# Patient Record
Sex: Male | Born: 1975 | Race: Black or African American | Hispanic: No | Marital: Married | Smoking: Former smoker
Health system: Southern US, Community
[De-identification: ages and names within clinical notes are randomized; demographics above are authoritative.]

## PROBLEM LIST (undated history)

## (undated) DIAGNOSIS — G4733 Obstructive sleep apnea (adult) (pediatric): Secondary | ICD-10-CM

## (undated) DIAGNOSIS — T7840XA Allergy, unspecified, initial encounter: Secondary | ICD-10-CM

## (undated) DIAGNOSIS — G4730 Sleep apnea, unspecified: Secondary | ICD-10-CM

## (undated) DIAGNOSIS — K219 Gastro-esophageal reflux disease without esophagitis: Secondary | ICD-10-CM

## (undated) DIAGNOSIS — M170 Bilateral primary osteoarthritis of knee: Secondary | ICD-10-CM

## (undated) DIAGNOSIS — F419 Anxiety disorder, unspecified: Secondary | ICD-10-CM

## (undated) DIAGNOSIS — I10 Essential (primary) hypertension: Secondary | ICD-10-CM

## (undated) HISTORY — DX: Gastro-esophageal reflux disease without esophagitis: K21.9

## (undated) HISTORY — PX: JOINT REPLACEMENT: SHX530

## (undated) HISTORY — PX: COLONOSCOPY: SHX174

## (undated) HISTORY — DX: Allergy, unspecified, initial encounter: T78.40XA

## (undated) HISTORY — DX: Sleep apnea, unspecified: G47.30

## (undated) HISTORY — DX: Essential (primary) hypertension: I10

## (undated) HISTORY — DX: Anxiety disorder, unspecified: F41.9

## (undated) HISTORY — PX: NO PAST SURGERIES: SHX2092

---

## 2001-06-05 ENCOUNTER — Encounter: Payer: Self-pay | Admitting: Emergency Medicine

## 2001-06-05 ENCOUNTER — Emergency Department (HOSPITAL_COMMUNITY): Admission: EM | Admit: 2001-06-05 | Discharge: 2001-06-05 | Payer: Self-pay | Admitting: Emergency Medicine

## 2001-06-16 ENCOUNTER — Emergency Department (HOSPITAL_COMMUNITY): Admission: EM | Admit: 2001-06-16 | Discharge: 2001-06-17 | Payer: Self-pay | Admitting: Emergency Medicine

## 2001-10-04 ENCOUNTER — Emergency Department (HOSPITAL_COMMUNITY): Admission: EM | Admit: 2001-10-04 | Discharge: 2001-10-04 | Payer: Self-pay | Admitting: Emergency Medicine

## 2002-01-28 ENCOUNTER — Emergency Department (HOSPITAL_COMMUNITY): Admission: EM | Admit: 2002-01-28 | Discharge: 2002-01-28 | Payer: Self-pay | Admitting: Emergency Medicine

## 2002-01-28 ENCOUNTER — Encounter: Payer: Self-pay | Admitting: Emergency Medicine

## 2004-07-02 ENCOUNTER — Emergency Department (HOSPITAL_COMMUNITY): Admission: EM | Admit: 2004-07-02 | Discharge: 2004-07-02 | Payer: Self-pay | Admitting: Emergency Medicine

## 2006-04-24 ENCOUNTER — Emergency Department (HOSPITAL_COMMUNITY): Admission: EM | Admit: 2006-04-24 | Discharge: 2006-04-24 | Payer: Self-pay | Admitting: Emergency Medicine

## 2006-05-03 ENCOUNTER — Emergency Department (HOSPITAL_COMMUNITY): Admission: EM | Admit: 2006-05-03 | Discharge: 2006-05-03 | Payer: Self-pay | Admitting: Emergency Medicine

## 2006-05-18 ENCOUNTER — Emergency Department (HOSPITAL_COMMUNITY): Admission: EM | Admit: 2006-05-18 | Discharge: 2006-05-18 | Payer: Self-pay | Admitting: *Deleted

## 2006-08-19 ENCOUNTER — Emergency Department (HOSPITAL_COMMUNITY): Admission: EM | Admit: 2006-08-19 | Discharge: 2006-08-19 | Payer: Self-pay | Admitting: Emergency Medicine

## 2006-11-17 ENCOUNTER — Emergency Department (HOSPITAL_COMMUNITY): Admission: EM | Admit: 2006-11-17 | Discharge: 2006-11-17 | Payer: Self-pay | Admitting: Emergency Medicine

## 2007-02-04 ENCOUNTER — Emergency Department (HOSPITAL_COMMUNITY): Admission: EM | Admit: 2007-02-04 | Discharge: 2007-02-04 | Payer: Self-pay | Admitting: Family Medicine

## 2011-04-28 ENCOUNTER — Emergency Department (HOSPITAL_BASED_OUTPATIENT_CLINIC_OR_DEPARTMENT_OTHER)
Admission: EM | Admit: 2011-04-28 | Discharge: 2011-04-28 | Disposition: A | Discharge status: Home / Self Care | Payer: BC Managed Care – PPO | Attending: Emergency Medicine | Admitting: Emergency Medicine

## 2011-04-28 ENCOUNTER — Encounter: Payer: Self-pay | Admitting: *Deleted

## 2011-04-28 DIAGNOSIS — X58XXXA Exposure to other specified factors, initial encounter: Secondary | ICD-10-CM | POA: Insufficient documentation

## 2011-04-28 DIAGNOSIS — F17200 Nicotine dependence, unspecified, uncomplicated: Secondary | ICD-10-CM | POA: Insufficient documentation

## 2011-04-28 DIAGNOSIS — S025XXA Fracture of tooth (traumatic), initial encounter for closed fracture: Secondary | ICD-10-CM | POA: Insufficient documentation

## 2011-04-28 MED ORDER — OXYCODONE-ACETAMINOPHEN 5-325 MG PO TABS: 2.0000 | ORAL_TABLET | ORAL | Status: AC | PRN

## 2011-04-28 MED ORDER — IBUPROFEN 600 MG PO TABS: 600.0000 mg | ORAL_TABLET | Freq: Four times a day (QID) | ORAL | Status: AC | PRN

## 2011-04-28 MED ORDER — OXYCODONE-ACETAMINOPHEN 5-325 MG PO TABS
2.0000 | ORAL_TABLET | Freq: Once | ORAL | Status: AC
  Administered 2011-04-28: 2 via ORAL
  Filled 2011-04-28: qty 2

## 2011-04-28 NOTE — ED Notes (Signed)
Pt presents to ED with dental pain.  Pt has some redness noted to upper and lower teeth.  Pt reports habit of grinding teeth.

## 2011-04-28 NOTE — ED Notes (Signed)
Family at bedside. 

## 2011-04-28 NOTE — ED Provider Notes (Signed)
History     Chief Complaint  Patient presents with  . Dental Pain   Patient is a 35 y.o. male presenting with tooth pain. The history is provided by the patient.  Dental PainThe primary symptoms include mouth pain. Primary symptoms do not include dental injury, oral bleeding, oral lesions or fever. The symptoms began yesterday. The symptoms are worsening. The symptoms are recurrent. The symptoms occur constantly.  Additional symptoms include: dental sensitivity to temperature and gum swelling. Additional symptoms do not include: jaw pain and facial swelling.    History reviewed. No pertinent past medical history.  History reviewed. No pertinent past surgical history.  History reviewed. No pertinent family history.  History  Substance Use Topics  . Smoking status: Current Everyday Smoker -- 0.5 packs/day  . Smokeless tobacco: Not on file  . Alcohol Use: No      Review of Systems  Constitutional: Negative for fever.  HENT: Negative for facial swelling.   All other systems reviewed and are negative.    Physical Exam  BP 133/74  Pulse 85  Temp(Src) 98 F (36.7 C) (Oral)  SpO2 98%  Physical Exam  Nursing note and vitals reviewed. Constitutional: He is oriented to person, place, and time. Vital signs are normal. He appears well-developed and well-nourished.  Non-toxic appearance.  HENT:  Head: Normocephalic and atraumatic.  Mouth/Throat: Dental caries present. No dental abscesses.  Eyes: Conjunctivae are normal. Pupils are equal, round, and reactive to light.  Neck: Normal range of motion.  Cardiovascular: Normal rate.   Pulmonary/Chest: Effort normal.  Neurological: He is alert and oriented to person, place, and time.  Skin: Skin is warm and dry.  Psychiatric: He has a normal mood and affect.    ED Course  Procedures  MDM  Pain meds given, pt to see his dentist      Toy Baker, MD 04/28/11 [phone]

## 2011-04-28 NOTE — ED Notes (Signed)
Pt presents to ED today with upper and lower dental pain since yesterday.  Pt took OTC ibuprofen around 2000 with no relief

## 2011-05-24 ENCOUNTER — Encounter: Payer: Self-pay | Admitting: *Deleted

## 2011-05-24 ENCOUNTER — Emergency Department (HOSPITAL_BASED_OUTPATIENT_CLINIC_OR_DEPARTMENT_OTHER)
Admission: EM | Admit: 2011-05-24 | Discharge: 2011-05-24 | Disposition: A | Discharge status: Home / Self Care | Payer: BC Managed Care – PPO | Attending: Emergency Medicine | Admitting: Emergency Medicine

## 2011-05-24 ENCOUNTER — Emergency Department (INDEPENDENT_AMBULATORY_CARE_PROVIDER_SITE_OTHER): Payer: BC Managed Care – PPO

## 2011-05-24 DIAGNOSIS — R319 Hematuria, unspecified: Secondary | ICD-10-CM

## 2011-05-24 DIAGNOSIS — F17200 Nicotine dependence, unspecified, uncomplicated: Secondary | ICD-10-CM | POA: Insufficient documentation

## 2011-05-24 DIAGNOSIS — N3000 Acute cystitis without hematuria: Secondary | ICD-10-CM | POA: Insufficient documentation

## 2011-05-24 LAB — URINALYSIS, ROUTINE W REFLEX MICROSCOPIC
Bilirubin Urine: NEGATIVE
Glucose, UA: NEGATIVE mg/dL
Ketones, ur: NEGATIVE mg/dL
Leukocytes, UA: NEGATIVE
Nitrite: NEGATIVE
Protein, ur: NEGATIVE mg/dL
Specific Gravity, Urine: 1.022 (ref 1.005–1.030)
Urobilinogen, UA: 0.2 mg/dL (ref 0.0–1.0)
pH: 5.5 (ref 5.0–8.0)

## 2011-05-24 LAB — CBC
Hemoglobin: 14.9 g/dL (ref 13.0–17.0)
MCH: 32 pg (ref 26.0–34.0)
MCV: 89.1 fL (ref 78.0–100.0)
Platelets: 271 10*3/uL (ref 150–400)
RBC: 4.66 MIL/uL (ref 4.22–5.81)

## 2011-05-24 LAB — DIFFERENTIAL
Eosinophils Absolute: 0.2 10*3/uL (ref 0.0–0.7)
Eosinophils Relative: 3 % (ref 0–5)
Lymphs Abs: 3.4 10*3/uL (ref 0.7–4.0)
Monocytes Relative: 9 % (ref 3–12)

## 2011-05-24 LAB — BASIC METABOLIC PANEL
BUN: 11 mg/dL (ref 6–23)
CO2: 26 mEq/L (ref 19–32)
Calcium: 10 mg/dL (ref 8.4–10.5)
Chloride: 105 mEq/L (ref 96–112)
Creatinine, Ser: 0.8 mg/dL (ref 0.50–1.35)
GFR calc Af Amer: >60 mL/min (ref >60)
GFR calc non Af Amer: >60 mL/min (ref >60)
Glucose, Bld: 103 mg/dL — ABNORMAL HIGH (ref 70–99)
Potassium: 4 mEq/L (ref 3.5–5.1)
Sodium: 139 mEq/L (ref 135–145)

## 2011-05-24 LAB — URINE MICROSCOPIC-ADD ON

## 2011-05-24 MED ORDER — CIPROFLOXACIN HCL 500 MG PO TABS
500.0000 mg | ORAL_TABLET | Freq: Once | ORAL | Status: AC
  Administered 2011-05-24: 500 mg via ORAL
  Filled 2011-05-24: qty 1

## 2011-05-24 MED ORDER — CIPROFLOXACIN HCL 500 MG PO TABS: 500.0000 mg | ORAL_TABLET | Freq: Two times a day (BID) | ORAL | Status: AC

## 2011-05-24 NOTE — ED Provider Notes (Addendum)
History    the patient presents for evaluation today of blood seen in his urine. He reports waking up this morning and using the bathroom to void his bladder, and noticing blood in the toilet bowl in with his urine. He then used the bathroom later a second time and again noted blood in his urine. He denies any pain, dysuria, urinary urgency, urinary frequency, flank pain, abdominal pain, fever, chills, nausea or vomiting. She denies myalgia, recent trauma to the flanks, or previous symptoms similarly. He denies urinary hesitancy or retention.  CSN: [id number] Arrival date & time: 05/24/2011  5:55 AM  Chief Complaint  Patient presents with  . Hematuria   Patient is a 35 y.o. male presenting with hematuria. The history is provided by the patient.  Hematuria This is a new problem. The current episode started today. The problem has been gradually improving since onset. He describes the hematuria as gross hematuria. The hematuria occurs during the initial portion of his urinary stream. He reports no clotting in his urine stream. His pain is at a severity of 0/10. He is experiencing no pain. He describes his urine color as tea colored. Irritative symptoms do not include frequency, nocturia or urgency. Obstructive symptoms do not include dribbling, incomplete emptying, an intermittent stream, a slower stream, straining or a weak stream. Pertinent negatives include no abdominal pain, chills, dysuria, facial swelling, fever, flank pain, genital pain, hesitancy, inability to urinate, nausea or vomiting. His sexual activity is non-contributory to the current illness. His past medical history is significant for tobacco use. There is no history of BPH, GU trauma, kidney stones, prostatitis or recent infection.    History reviewed. No pertinent past medical history.  History reviewed. No pertinent past surgical history.  No family history on file.  History  Substance Use Topics  . Smoking status: Current  Everyday Smoker -- 0.5 packs/day    Types: Cigarettes  . Smokeless tobacco: Not on file  . Alcohol Use: No      Review of Systems  Constitutional: Negative for fever and chills.  HENT: Negative for facial swelling.   Eyes: Negative.   Respiratory: Negative.   Cardiovascular: Negative.   Gastrointestinal: Negative for nausea, vomiting and abdominal pain.  Genitourinary: Positive for hematuria. Negative for dysuria, hesitancy, urgency, frequency, flank pain, incomplete emptying and nocturia.  Musculoskeletal: Negative for myalgias, back pain, joint swelling and arthralgias.  Skin: Negative for rash.  Neurological: Negative for light-headedness and headaches.  Hematological: Does not bruise/bleed easily.  Psychiatric/Behavioral: Negative.     Physical Exam  BP 134/86  Pulse 79  Temp(Src) 97.8 F (36.6 C) (Oral)  Resp 16  SpO2 97%  Physical Exam  Nursing note and vitals reviewed. Constitutional: He is oriented to person, place, and time. He appears well-developed and well-nourished. No distress.  HENT:  Head: Normocephalic and atraumatic.  Nose: Nose normal.  Mouth/Throat: Oropharynx is clear and moist. No oropharyngeal exudate.  Eyes: Conjunctivae and EOM are normal. Pupils are equal, round, and reactive to light. Right eye exhibits no discharge. Left eye exhibits no discharge.  Neck: Normal range of motion. Neck supple.  Cardiovascular: Normal rate, regular rhythm and normal heart sounds.  Exam reveals no gallop and no friction rub.   No murmur heard. Pulmonary/Chest: Effort normal and breath sounds normal. No respiratory distress. He has no wheezes. He has no rales. He exhibits no tenderness.  Abdominal: Soft. Bowel sounds are normal. He exhibits no distension. There is no tenderness. There is no rebound  and no guarding.  Genitourinary: Testes normal and penis normal. Cremasteric reflex is present. Right testis shows no mass, no swelling and no tenderness. Left testis shows  no mass, no swelling and no tenderness. Uncircumcised. No penile erythema or penile tenderness. No discharge found.  Musculoskeletal: Normal range of motion. He exhibits no edema and no tenderness.  Neurological: He is alert and oriented to person, place, and time. No cranial nerve deficit.  Skin: Skin is warm and dry. No rash noted. No erythema.  Psychiatric: He has a normal mood and affect. His behavior is normal. Judgment and thought content normal.    ED Course  Procedures  MDM Differential diagnostic considerations include glomerular source of bleeding, extra glomerular source of bleeding, hemorrhagic cystitis, malignancy, urinary tract infection, ureteral calculus, rhabdomyolysis, or idiopathic transient hematuria. As the patient is a smoker and has gross hematuria, if it is determined that this is an extra glomerular source of bleeding as determined by absence of red cell casts in the urine, I will need to obtain a noncontrast CT of the abdomen and pelvis to evaluate for malignancy.   Urinalysis did not suggest the glomerular cause of hematuria, and as such the CT scan of the abdomen and pelvis without contrast to evaluate the renal and urologic tract was performed and results normal. There are a few bacteria in the patient's urine along with blood representative of some degree of inflammation, and as such I've sent a urine culture to evaluate further for urinary tract infection. I will treat the patient as a cystitis with antibiotics. However given that he has had an episode of gross hematuria, I will be referring him to urology to be evaluated for possible need of cystoscopy as well as to evaluate for prostatic enlargement or other cause that would predispose him to urinary tract infection. I reviewed this plan of care with the patient and he states his understanding of and agreement with it.   Felisa Bonier, MD 05/24/11 1610  Felisa Bonier, MD 05/24/11 [id number]  Felisa Bonier,  MD 05/24/11 [phone]

## 2011-05-24 NOTE — ED Notes (Signed)
Pt presents to ED today with blood in urine x2 this am.  Pt states "woke up and used the bathroom and saw the blood"  Pt has no history of similar episodes

## 2011-05-25 LAB — URINE CULTURE
Colony Count: NO GROWTH
Culture  Setup Time: 201208161458
Culture: NO GROWTH

## 2012-05-13 ENCOUNTER — Encounter (HOSPITAL_BASED_OUTPATIENT_CLINIC_OR_DEPARTMENT_OTHER): Payer: Self-pay | Admitting: *Deleted

## 2012-05-13 ENCOUNTER — Emergency Department (HOSPITAL_BASED_OUTPATIENT_CLINIC_OR_DEPARTMENT_OTHER)
Admission: EM | Admit: 2012-05-13 | Discharge: 2012-05-13 | Disposition: A | Discharge status: Home / Self Care | Payer: BC Managed Care – PPO | Attending: Emergency Medicine | Admitting: Emergency Medicine

## 2012-05-13 DIAGNOSIS — F17200 Nicotine dependence, unspecified, uncomplicated: Secondary | ICD-10-CM | POA: Insufficient documentation

## 2012-05-13 DIAGNOSIS — J069 Acute upper respiratory infection, unspecified: Secondary | ICD-10-CM

## 2012-05-13 DIAGNOSIS — H9209 Otalgia, unspecified ear: Secondary | ICD-10-CM

## 2012-05-13 MED ORDER — ANTIPYRINE-BENZOCAINE 5.4-1.4 % OT SOLN
3.0000 [drp] | OTIC | Status: DC | PRN
  Administered 2012-05-13: 3 [drp] via OTIC
  Filled 2012-05-13: qty 10

## 2012-05-13 MED ORDER — AZITHROMYCIN 250 MG PO TABS: 250.0000 mg | ORAL_TABLET | Freq: Every day | ORAL | Status: AC

## 2012-05-13 MED ORDER — LORATADINE-PSEUDOEPHEDRINE ER 10-240 MG PO TB24: 1.0000 | ORAL_TABLET | Freq: Every day | ORAL | Status: AC

## 2012-05-13 NOTE — ED Provider Notes (Signed)
History     CSN: [id number]  Arrival date & time 05/13/12  1603   First MD Initiated Contact with Patient 05/13/12 1649      Chief Complaint  Patient presents with  . Otalgia    (Consider location/radiation/quality/duration/timing/severity/associated sxs/prior treatment) Patient is a 36 y.o. male presenting with ear pain. The history is provided by the patient.  Otalgia This is a new problem. The current episode started more than 2 days ago. There is pain in the right ear. The problem has been gradually worsening. There has been no fever. Pertinent negatives include no ear discharge, no hearing loss, no rhinorrhea, no sore throat, no vomiting, no neck pain and no cough. Associated symptoms comments: Right ear pain that is progressively worse over the last 2-3 days, causing right sided headache. He feels mildly lightheaded and has had some nausea. No room-spinning dizziness. No fever, significant nasal congestion but reports possible sinus pressure that he tried taking "sinus medicine" for without relief. No sore throat. No cough. He denies ear drainage, bleeding or hearing changes..    History reviewed. No pertinent past medical history.  History reviewed. No pertinent past surgical history.  History reviewed. No pertinent family history.  History  Substance Use Topics  . Smoking status: Current Everyday Smoker -- 0.5 packs/day    Types: Cigarettes  . Smokeless tobacco: Not on file  . Alcohol Use: No      Review of Systems  Constitutional: Positive for chills. Negative for fever.  HENT: Positive for ear pain. Negative for hearing loss, sore throat, rhinorrhea, neck pain and ear discharge.   Respiratory: Negative for cough.   Gastrointestinal: Negative for vomiting.    Allergies  Review of patient's allergies indicates no known allergies.  Home Medications  No current outpatient prescriptions on file.  BP 147/77  Pulse 99  Temp 98.9 F (37.2 C)  Resp 16  Ht 5\' 8"   (1.727 m)  Wt 190 lb (86.183 kg)  BMI 28.89 kg/m2  SpO2 99%  Physical Exam  Constitutional: He is oriented to person, place, and time. He appears well-developed and well-nourished.  HENT:  Head: Normocephalic.  Left Ear: External ear normal.  Nose: Mucosal edema present. Right sinus exhibits frontal sinus tenderness. Left sinus exhibits frontal sinus tenderness.  Mouth/Throat: Oropharynx is clear and moist.       Right TM has fluid collection that does not appear purulent in middle ear. No redness. TM is full, not dull. External canal is unremarkable.  Neck: Normal range of motion. Neck supple.  Cardiovascular: Normal rate and normal heart sounds.   No murmur heard. Pulmonary/Chest: Effort normal and breath sounds normal. He has no wheezes. He has no rales.  Abdominal: Soft. Bowel sounds are normal. He exhibits no distension. There is no tenderness.  Musculoskeletal: Normal range of motion.  Lymphadenopathy:    He has no cervical adenopathy.  Neurological: He is alert and oriented to person, place, and time.  Skin: Skin is warm and dry. No pallor.    ED Course  Procedures (including critical care time)  Labs Reviewed - No data to display No results found.   No diagnosis found.  1. URI 2. Otalgia   MDM  URI, with symptoms of ear pain. No further evaluation required. Stable for discharge.        Rodena Medin, PA-C 05/13/12 1737

## 2012-05-13 NOTE — ED Notes (Signed)
Pt c/o right ear pain x4days.   

## 2012-05-14 NOTE — ED Provider Notes (Signed)
Medical screening examination/treatment/procedure(s) were performed by non-physician practitioner and as supervising physician I was immediately available for consultation/collaboration.  Soleia Badolato, MD 05/14/12 1429 

## 2012-06-17 ENCOUNTER — Encounter (HOSPITAL_BASED_OUTPATIENT_CLINIC_OR_DEPARTMENT_OTHER): Payer: Self-pay | Admitting: *Deleted

## 2012-06-17 ENCOUNTER — Emergency Department (HOSPITAL_BASED_OUTPATIENT_CLINIC_OR_DEPARTMENT_OTHER)
Admission: EM | Admit: 2012-06-17 | Discharge: 2012-06-17 | Disposition: A | Discharge status: Home / Self Care | Payer: BC Managed Care – PPO | Attending: Emergency Medicine | Admitting: Emergency Medicine

## 2012-06-17 DIAGNOSIS — F17200 Nicotine dependence, unspecified, uncomplicated: Secondary | ICD-10-CM | POA: Insufficient documentation

## 2012-06-17 DIAGNOSIS — R109 Unspecified abdominal pain: Secondary | ICD-10-CM

## 2012-06-17 DIAGNOSIS — R112 Nausea with vomiting, unspecified: Secondary | ICD-10-CM

## 2012-06-17 LAB — URINALYSIS, ROUTINE W REFLEX MICROSCOPIC
Bilirubin Urine: NEGATIVE
Glucose, UA: NEGATIVE mg/dL
Hgb urine dipstick: NEGATIVE
Ketones, ur: 15 mg/dL — AB
Leukocytes, UA: NEGATIVE
Nitrite: NEGATIVE
Protein, ur: NEGATIVE mg/dL
Specific Gravity, Urine: 1.028 (ref 1.005–1.030)
Urobilinogen, UA: 0.2 mg/dL (ref 0.0–1.0)
pH: 5.5 (ref 5.0–8.0)

## 2012-06-17 MED ORDER — PROMETHAZINE HCL 25 MG PO TABS: 25.0000 mg | ORAL_TABLET | Freq: Four times a day (QID) | ORAL | Status: DC | PRN

## 2012-06-17 MED ORDER — ONDANSETRON 8 MG PO TBDP: 8.0000 mg | ORAL_TABLET | Freq: Two times a day (BID) | ORAL | Status: AC | PRN

## 2012-06-17 MED ORDER — SODIUM CHLORIDE 0.9 % IV BOLUS (SEPSIS)
1000.0000 mL | Freq: Once | INTRAVENOUS | Status: AC
  Administered 2012-06-17: 1000 mL via INTRAVENOUS

## 2012-06-17 MED ORDER — ONDANSETRON 8 MG PO TBDP
8.0000 mg | ORAL_TABLET | Freq: Once | ORAL | Status: AC
  Administered 2012-06-17: 8 mg via ORAL
  Filled 2012-06-17: qty 1

## 2012-06-17 MED ORDER — ONDANSETRON HCL 4 MG/2ML IJ SOLN
4.0000 mg | Freq: Once | INTRAMUSCULAR | Status: AC
  Administered 2012-06-17: 4 mg via INTRAVENOUS
  Filled 2012-06-17: qty 2

## 2012-06-17 NOTE — ED Provider Notes (Signed)
History     CSN: [id number]  Arrival date & time 06/17/12  0431   First MD Initiated Contact with Patient 06/17/12 0445      Chief Complaint  Patient presents with  . Emesis    (Consider location/radiation/quality/duration/timing/severity/associated sxs/prior treatment) HPI Comments: Pt reports was in usual state of health except for some mild urge to urinate and saw blood in it once, then began having profuse vomiting since about 1:30.  He had one loose stool.  No obv sick contacts, isn't on any meds.  No abx use, foreign travel.  No h/o kidney stones.  Had no abd pain prior to vomiting, since then has abd soreness that he attributes to all of the vomiting.  Feels weak, hasn't slept any.  Can't keep fluids down.    Patient is a 36 y.o. male presenting with vomiting. The history is provided by the patient.  Emesis  Associated symptoms include abdominal pain. Pertinent negatives include no chills, no cough, no diarrhea, no fever and no headaches.    History reviewed. No pertinent past medical history.  History reviewed. No pertinent past surgical history.  History reviewed. No pertinent family history.  History  Substance Use Topics  . Smoking status: Current Everyday Smoker -- 0.5 packs/day    Types: Cigarettes  . Smokeless tobacco: Not on file  . Alcohol Use: No      Review of Systems  Constitutional: Positive for fatigue. Negative for fever and chills.  Respiratory: Negative for cough and shortness of breath.   Cardiovascular: Negative for chest pain.  Gastrointestinal: Positive for nausea, vomiting and abdominal pain. Negative for diarrhea.  Genitourinary: Positive for dysuria and hematuria.  Musculoskeletal: Negative for back pain.  Skin: Negative for rash.  Neurological: Positive for weakness. Negative for numbness and headaches.  All other systems reviewed and are negative.    Allergies  Review of patient's allergies indicates no known allergies.  Home  Medications   Current Outpatient Rx  Name Route Sig Dispense Refill  . LORATADINE-PSEUDOEPHEDRINE ER 10-240 MG PO TB24 Oral Take 1 tablet by mouth daily. 7 tablet 0  . ONDANSETRON 8 MG PO TBDP Oral Take 1 tablet (8 mg total) by mouth every 12 (twelve) hours as needed for nausea. 20 tablet 0  . PROMETHAZINE HCL 25 MG PO TABS Oral Take 1 tablet (25 mg total) by mouth every 6 (six) hours as needed for nausea. 20 tablet 0    BP 116/65  Pulse 66  Temp 98 F (36.7 C) (Oral)  Resp 17  Ht 5\' 9"  (1.753 m)  Wt 180 lb (81.647 kg)  BMI 26.58 kg/m2  SpO2 100%  Physical Exam  Nursing note and vitals reviewed. Constitutional: He is oriented to person, place, and time. He appears well-developed and well-nourished.  HENT:  Head: Normocephalic and atraumatic.  Mouth/Throat: Uvula is midline. Mucous membranes are not pale and dry.  Eyes: Pupils are equal, round, and reactive to light. No scleral icterus.  Neck: Normal range of motion. Neck supple.  Cardiovascular:  No murmur heard. Pulmonary/Chest: Effort normal. No respiratory distress. He has no wheezes.  Abdominal: Soft. Bowel sounds are normal. He exhibits no distension. There is no tenderness. There is no rebound and no guarding.  Musculoskeletal: He exhibits no tenderness.  Lymphadenopathy:    He has no cervical adenopathy.  Neurological: He is alert and oriented to person, place, and time. No cranial nerve deficit. Coordination normal.  Skin: Skin is warm.  Psychiatric: He has a normal  mood and affect.    ED Course  Procedures (including critical care time)  Labs Reviewed  URINALYSIS, ROUTINE W REFLEX MICROSCOPIC - Abnormal; Notable for the following:    Ketones, ur 15 (*)     All other components within normal limits   No results found.   1. Nausea and vomiting   2. Abdominal cramping     RA sat is 100% and I interpret to be normal.  5:00 AM Pt not tolerating PO zofran.  Pt is mildly tachycardic, will proceed with IV  meds and IVF bolus.   6:40 AM Pt feels improved, able to keep down ice and some water without vomiting.  I suggested he stay home for 1 day to recover, and to advance diet slowly.  HR is much improved.    MDM  abd not tender.  Will check UA, try Zofran for nausea and oral rehydration.  Pt with no sig PMH.  If he continues to have symptoms, will give IVF's and IV meds.          Gavin Pound. Oletta Lamas, MD 06/17/12 [phone]

## 2012-06-17 NOTE — ED Notes (Signed)
Pt was able to keep a few ice chips down, but states his nausea started to return some.

## 2012-06-17 NOTE — ED Notes (Signed)
Sudden onset of vomiting x3 hours ago. Multiple episodes. Denies abdominal pains "other than from vomiting". One episode of diarrhea yesterday afternoon. Denies fevers or body aches. Also c/o hematuria since 11pm last night. Mild dysuria.

## 2012-06-17 NOTE — ED Notes (Signed)
Pt given ice chips for PO challenge °

## 2013-09-14 ENCOUNTER — Encounter (HOSPITAL_BASED_OUTPATIENT_CLINIC_OR_DEPARTMENT_OTHER): Payer: Self-pay | Admitting: Emergency Medicine

## 2013-09-14 ENCOUNTER — Emergency Department (HOSPITAL_BASED_OUTPATIENT_CLINIC_OR_DEPARTMENT_OTHER)
Admission: EM | Admit: 2013-09-14 | Discharge: 2013-09-14 | Disposition: A | Discharge status: Home / Self Care | Payer: BC Managed Care – PPO | Attending: Emergency Medicine | Admitting: Emergency Medicine

## 2013-09-14 DIAGNOSIS — X500XXA Overexertion from strenuous movement or load, initial encounter: Secondary | ICD-10-CM | POA: Insufficient documentation

## 2013-09-14 DIAGNOSIS — S335XXA Sprain of ligaments of lumbar spine, initial encounter: Secondary | ICD-10-CM | POA: Insufficient documentation

## 2013-09-14 DIAGNOSIS — Y9389 Activity, other specified: Secondary | ICD-10-CM | POA: Insufficient documentation

## 2013-09-14 DIAGNOSIS — F17200 Nicotine dependence, unspecified, uncomplicated: Secondary | ICD-10-CM | POA: Insufficient documentation

## 2013-09-14 DIAGNOSIS — Y9289 Other specified places as the place of occurrence of the external cause: Secondary | ICD-10-CM | POA: Insufficient documentation

## 2013-09-14 DIAGNOSIS — S39012A Strain of muscle, fascia and tendon of lower back, initial encounter: Secondary | ICD-10-CM

## 2013-09-14 MED ORDER — OXYCODONE-ACETAMINOPHEN 5-325 MG PO TABS: 1.0000 | ORAL_TABLET | Freq: Four times a day (QID) | ORAL | Status: DC | PRN

## 2013-09-14 MED ORDER — HYDROMORPHONE HCL PF 1 MG/ML IJ SOLN
1.0000 mg | Freq: Once | INTRAMUSCULAR | Status: AC
  Administered 2013-09-14: 1 mg via INTRAMUSCULAR
  Filled 2013-09-14: qty 1

## 2013-09-14 MED ORDER — CYCLOBENZAPRINE HCL 10 MG PO TABS
5.0000 mg | ORAL_TABLET | Freq: Once | ORAL | Status: AC
  Administered 2013-09-14: 5 mg via ORAL
  Filled 2013-09-14: qty 1

## 2013-09-14 MED ORDER — CYCLOBENZAPRINE HCL 5 MG PO TABS: 5.0000 mg | ORAL_TABLET | Freq: Three times a day (TID) | ORAL | Status: DC | PRN

## 2013-09-14 MED ORDER — KETOROLAC TROMETHAMINE 60 MG/2ML IM SOLN
60.0000 mg | Freq: Once | INTRAMUSCULAR | Status: AC
  Administered 2013-09-14: 60 mg via INTRAMUSCULAR
  Filled 2013-09-14: qty 2

## 2013-09-14 MED ORDER — NAPROXEN 500 MG PO TABS: 500.0000 mg | ORAL_TABLET | Freq: Two times a day (BID) | ORAL | Status: DC

## 2013-09-14 NOTE — ED Notes (Signed)
Patient states he was getting out of his car yesterday, and when he put his foot down on the ground he felt pain in his middle lower back.  States he took Ibuprofen and ice with no relief.  States the pain is worse, and is now radiating into the right side.  Denies injury.  No previous history of back pain.

## 2013-09-14 NOTE — ED Provider Notes (Signed)
CSN: [id number]     Arrival date & time 09/14/13  8413 History   First MD Initiated Contact with Patient 09/14/13 [phone]     Chief Complaint  Patient presents with  . Back Pain   (Consider location/radiation/quality/duration/timing/severity/associated sxs/prior Treatment) Patient is a 37 y.o. male presenting with back pain.  Back Pain Location:  Lumbar spine Quality:  Stabbing Radiates to:  Does not radiate Pain severity:  Severe Onset quality:  Gradual Duration:  1 day Timing:  Constant Chronicity:  New Context: not MVA   Context comment:  Stepped out of his car wrong and jarred his back.   Relieved by:  Nothing Worsened by:  Bending, movement and coughing Ineffective treatments:  None tried Associated symptoms: no bladder incontinence, no bowel incontinence, no fever, no perianal numbness, no tingling and no weakness     History reviewed. No pertinent past medical history. History reviewed. No pertinent past surgical history. No family history on file. History  Substance Use Topics  . Smoking status: Current Every Day Smoker -- 0.50 packs/day    Types: Cigarettes  . Smokeless tobacco: Not on file  . Alcohol Use: No    Review of Systems  Constitutional: Negative for fever.  Gastrointestinal: Negative for bowel incontinence.  Genitourinary: Negative for bladder incontinence.  Musculoskeletal: Positive for back pain.  Neurological: Negative for tingling and weakness.  All other systems reviewed and are negative.    Allergies  Review of patient's allergies indicates no known allergies.  Home Medications   Current Outpatient Rx  Name  Route  Sig  Dispense  Refill  . ibuprofen (ADVIL,MOTRIN) 800 MG tablet   Oral   Take 800 mg by mouth every 8 (eight) hours as needed.         Marland Kitchen EXPIRED: promethazine (PHENERGAN) 25 MG tablet   Oral   Take 1 tablet (25 mg total) by mouth every 6 (six) hours as needed for nausea.   20 tablet   0    BP 144/88  Pulse 72   Temp(Src) 98.4 F (36.9 C) (Oral)  Resp 18  Ht 5\' 8"  (1.727 m)  Wt 188 lb (85.276 kg)  BMI 28.59 kg/m2  SpO2 100% Physical Exam  Nursing note and vitals reviewed. Constitutional: He is oriented to person, place, and time. He appears well-developed and well-nourished. No distress.  HENT:  Head: Normocephalic and atraumatic.  Eyes: Conjunctivae are normal. No scleral icterus.  Neck: Neck supple.  Cardiovascular: Normal rate and intact distal pulses.   Pulmonary/Chest: Effort normal. No stridor. No respiratory distress.  Abdominal: Normal appearance. He exhibits no distension.  Musculoskeletal:       Lumbar back: He exhibits tenderness (worse on left) and bony tenderness. He exhibits normal range of motion and no swelling.  Neurological: He is alert and oriented to person, place, and time. He has normal strength. Gait (slow) abnormal.  Reflex Scores:      Patellar reflexes are 2+ on the right side and 2+ on the left side. Skin: Skin is warm and dry. No rash noted.  Psychiatric: He has a normal mood and affect. His behavior is normal.    ED Course  Procedures (including critical care time) Labs Review Labs Reviewed - No data to display Imaging Review No results found.  EKG Interpretation   None       MDM   1. Low back strain, initial encounter    37 year old male with low back pain which started when he stepped out of his  car yesterday.  Had mild pain in, but awoke this morning with severe pain. Never had flank symptoms. Denies history of cancer, fevers, IV drug use. No significant trauma, no indication for imaging. Will treat with IM Toradol, Dilaudid, by mouth Flexeril.    Candyce Churn, MD 09/14/13 1013

## 2015-08-22 ENCOUNTER — Ambulatory Visit (INDEPENDENT_AMBULATORY_CARE_PROVIDER_SITE_OTHER): Payer: BC Managed Care – PPO | Admitting: Internal Medicine

## 2015-08-22 ENCOUNTER — Encounter: Payer: Self-pay | Admitting: Internal Medicine

## 2015-08-22 VITALS — BP 124/90 | HR 78 | Temp 98.5°F | Resp 16 | Ht 69.0 in | Wt 236.0 lb

## 2015-08-22 DIAGNOSIS — R030 Elevated blood-pressure reading, without diagnosis of hypertension: Secondary | ICD-10-CM

## 2015-08-22 DIAGNOSIS — IMO0001 Reserved for inherently not codable concepts without codable children: Secondary | ICD-10-CM | POA: Insufficient documentation

## 2015-08-22 NOTE — Progress Notes (Signed)
Subjective:    Patient ID: Dylan Webster, male    DOB: [birth year], 39 y.o.   MRN: [id number]  HPI He is here to establish with a new pcp.  He has no concerns.    He quit smoking in February 2016.  He is not currently exercising.  He plans on starting to exercise and wants to lose weight.  He gained a lot of weight when he quit smoking.  Elevated blood pressure:  He has never been diagnosed with hypertension, but his blood pressure is elevated here today. He does not follow a low-sodium diet. He is currently not exercising regularly and has gained weight over the past several months. He has a family history of hypertension. He denies any chest pain, palpitations, shortness of breath and lower extremity edema.  Medications and allergies reviewed with patient and updated if appropriate.  Patient Active Problem List   Diagnosis Date Noted  . Elevated blood pressure 08/22/2015    No current outpatient prescriptions on file prior to visit.   No current facility-administered medications on file prior to visit.    Past Medical History  Diagnosis Date  . Allergy     Past Surgical History  Procedure Laterality Date  . No past surgeries      Social History   Social History  . Marital Status: Single    Spouse Name: N/A  . Number of Children: N/A  . Years of Education: N/A   Social History Main Topics  . Smoking status: Former Smoker -- 0.50 packs/day    Types: Cigarettes  . Smokeless tobacco: Never Used  . Alcohol Use: No  . Drug Use: No  . Sexual Activity: Yes    Birth Control/ Protection: None   Other Topics Concern  . None   Social History Narrative   Married, 4 children      Job: [employer]      Will start exercising regularly    Review of Systems  Constitutional: Negative for fever, chills and fatigue.  Respiratory: Negative for cough, shortness of breath and wheezing.   Cardiovascular: Negative for chest pain, palpitations and leg swelling.    Gastrointestinal: Negative for nausea, abdominal pain, diarrhea, constipation and blood in stool.       No GERD  Genitourinary: Negative for dysuria and hematuria.  Musculoskeletal: Negative for back pain and arthralgias.  Neurological: Negative for dizziness, light-headedness and headaches.  Psychiatric/Behavioral: Negative for dysphoric mood. The patient is not nervous/anxious.        Objective:   Filed Vitals:   08/22/15 1510  BP: 124/90  Pulse: 78  Temp: 98.5 F (36.9 C)  Resp: 16   Filed Weights   08/22/15 1510  Weight: 236 lb (107.049 kg)   Body mass index is 34.84 kg/(m^2).   Physical Exam  Constitutional: He appears well-developed and well-nourished. No distress.  HENT:  Head: Normocephalic and atraumatic.  Right Ear: External ear normal.  Left Ear: External ear normal.  Bilateral ear canals and tympanic membranes normal  Eyes: Conjunctivae are normal.  Neck: Neck supple. No tracheal deviation present. No thyromegaly present.  No carotid bruit  Cardiovascular: Normal rate, regular rhythm and normal heart sounds.   No murmur heard. Pulmonary/Chest: Effort normal and breath sounds normal. No respiratory distress. He has no wheezes. He has no rales.  Abdominal: Soft. He exhibits no distension. There is no tenderness.  Musculoskeletal: He exhibits no edema.  Lymphadenopathy:    He has no cervical  adenopathy.  Skin: Skin is warm and dry.  Psychiatric: He has a normal mood and affect. His behavior is normal.          Assessment & Plan:   See Problem List.   Follow up as needed

## 2015-08-22 NOTE — Progress Notes (Signed)
Pre visit review using our clinic review tool, if applicable. No additional management support is needed unless otherwise documented below in the visit note. 

## 2015-08-22 NOTE — Assessment & Plan Note (Signed)
Blood pressure slightly elevated here today, 124/90 No history of hypertension Discussed the importance of regular exercise, weight loss and low-sodium diet Discussed that he is at risk of developing hypertension given his family history Discussed potential consequences of uncontrolled hypertension Recommended monitoring his blood pressure with a goal of average blood pressures less than 140/90 He will work on his lifestyle

## 2015-08-22 NOTE — Patient Instructions (Signed)
Your blood pressure is slightly elevated here today.  You should start exercising, work on weight loss and make sure you are eating a low-sodium diet. You should also monitor your blood pressure-it should be on average less than 140/90.  All other Health Maintenance issues reviewed.   All recommended immunizations and age-appropriate screenings are up-to-date.  No immunizations administered today.   Follow up for a physical exam next year.  Hypertension Hypertension, commonly called high blood pressure, is when the force of blood pumping through your arteries is too strong. Your arteries are the blood vessels that carry blood from your heart throughout your body. A blood pressure reading consists of a higher number over a lower number, such as 110/72. The higher number (systolic) is the pressure inside your arteries when your heart pumps. The lower number (diastolic) is the pressure inside your arteries when your heart relaxes. Ideally you want your blood pressure below 120/80. Hypertension forces your heart to work harder to pump blood. Your arteries may become narrow or stiff. Having untreated or uncontrolled hypertension can cause heart attack, stroke, kidney disease, and other problems. RISK FACTORS Some risk factors for high blood pressure are controllable. Others are not.  Risk factors you cannot control include:   Race. You may be at higher risk if you are African American.  Age. Risk increases with age.  Gender. Men are at higher risk than women before age 61 years. After age 58, women are at higher risk than men. Risk factors you can control include:  Not getting enough exercise or physical activity.  Being overweight.  Getting too much fat, sugar, calories, or salt in your diet.  Drinking too much alcohol. SIGNS AND SYMPTOMS Hypertension does not usually cause signs or symptoms. Extremely high blood pressure (hypertensive crisis) may cause headache, anxiety, shortness of  breath, and nosebleed. DIAGNOSIS To check if you have hypertension, your health care provider will measure your blood pressure while you are seated, with your arm held at the level of your heart. It should be measured at least twice using the same arm. Certain conditions can cause a difference in blood pressure between your right and left arms. A blood pressure reading that is higher than normal on one occasion does not mean that you need treatment. If it is not clear whether you have high blood pressure, you may be asked to return on a different day to have your blood pressure checked again. Or, you may be asked to monitor your blood pressure at home for 1 or more weeks. TREATMENT Treating high blood pressure includes making lifestyle changes and possibly taking medicine. Living a healthy lifestyle can help lower high blood pressure. You may need to change some of your habits. Lifestyle changes may include:  Following the DASH diet. This diet is high in fruits, vegetables, and whole grains. It is low in salt, red meat, and added sugars.  Keep your sodium intake below 2,300 mg per day.  Getting at least 30-45 minutes of aerobic exercise at least 4 times per week.  Losing weight if necessary.  Not smoking.  Limiting alcoholic beverages.  Learning ways to reduce stress. Your health care provider may prescribe medicine if lifestyle changes are not enough to get your blood pressure under control, and if one of the following is true:  You are 63-36 years of age and your systolic blood pressure is above 140.  You are 17 years of age or older, and your systolic blood pressure is above  [id number].  Your diastolic blood pressure is above 90.  You have diabetes, and your systolic blood pressure is over [id number] or your diastolic blood pressure is over 90.  You have kidney disease and your blood pressure is above 140/90.  You have heart disease and your blood pressure is above 140/90. Your personal target  blood pressure may vary depending on your medical conditions, your age, and other factors. HOME CARE INSTRUCTIONS  Have your blood pressure rechecked as directed by your health care provider.   Take medicines only as directed by your health care provider. Follow the directions carefully. Blood pressure medicines must be taken as prescribed. The medicine does not work as well when you skip doses. Skipping doses also puts you at risk for problems.  Do not smoke.   Monitor your blood pressure at home as directed by your health care provider. SEEK MEDICAL CARE IF:   You think you are having a reaction to medicines taken.  You have recurrent headaches or feel dizzy.  You have swelling in your ankles.  You have trouble with your vision. SEEK IMMEDIATE MEDICAL CARE IF:  You develop a severe headache or confusion.  You have unusual weakness, numbness, or feel faint.  You have severe chest or abdominal pain.  You vomit repeatedly.  You have trouble breathing. MAKE SURE YOU:   Understand these instructions.  Will watch your condition.  Will get help right away if you are not doing well or get worse.   This information is not intended to replace advice given to you by your health care provider. Make sure you discuss any questions you have with your health care provider.   Document Released: 09/24/2005 Document Revised: 02/08/2015 Document Reviewed: 07/17/2013 Elsevier Interactive Patient Education [employer].

## 2016-05-01 ENCOUNTER — Emergency Department (HOSPITAL_BASED_OUTPATIENT_CLINIC_OR_DEPARTMENT_OTHER)
Admission: EM | Admit: 2016-05-01 | Discharge: 2016-05-01 | Disposition: A | Discharge status: Home / Self Care | Payer: BC Managed Care – PPO | Attending: Emergency Medicine | Admitting: Emergency Medicine

## 2016-05-01 ENCOUNTER — Encounter (HOSPITAL_BASED_OUTPATIENT_CLINIC_OR_DEPARTMENT_OTHER): Payer: Self-pay

## 2016-05-01 ENCOUNTER — Emergency Department (HOSPITAL_BASED_OUTPATIENT_CLINIC_OR_DEPARTMENT_OTHER): Payer: BC Managed Care – PPO

## 2016-05-01 DIAGNOSIS — R079 Chest pain, unspecified: Secondary | ICD-10-CM | POA: Diagnosis present

## 2016-05-01 DIAGNOSIS — Z87891 Personal history of nicotine dependence: Secondary | ICD-10-CM | POA: Diagnosis not present

## 2016-05-01 LAB — TROPONIN I: Troponin I: <0.03 ng/mL (ref <0.03)

## 2016-05-01 LAB — D-DIMER, QUANTITATIVE: D-Dimer, Quant: <0.27 ug/mL-FEU (ref 0.00–0.50)

## 2016-05-01 MED ORDER — KETOROLAC TROMETHAMINE 30 MG/ML IJ SOLN: 30.0000 mg | Freq: Once | INTRAMUSCULAR | Status: DC

## 2016-05-01 MED ORDER — KETOROLAC TROMETHAMINE 30 MG/ML IJ SOLN
30.0000 mg | Freq: Once | INTRAMUSCULAR | Status: AC
  Administered 2016-05-01: 30 mg via INTRAVENOUS
  Filled 2016-05-01: qty 1

## 2016-05-01 MED ORDER — NAPROXEN 500 MG PO TABS: 500.0000 mg | ORAL_TABLET | Freq: Two times a day (BID) | ORAL | 0 refills | Status: DC

## 2016-05-01 NOTE — ED Provider Notes (Signed)
[facility] DEPT [facility] Provider Note   CSN: [id number] Arrival date & time: 05/01/16  0157  First Provider Contact:  First MD Initiated Contact with Patient 05/01/16 0226        History   Chief Complaint Chief Complaint  Patient presents with  . Chest Pain    HPI Dylan Webster is a 40 y.o. male.  HPI  This is a 40 year old male with a history of hypertension who presents with chest pain. Patient reports one-day history of chest pain. He states that it started on the right and moved to the left. It is been somewhat constant but worsened and woke him up from his sleep. He denies any shortness of breath, diaphoresis, fevers, or cough. He denies any association with food. He describes it as crampy. Currently his pain is 7 out of 10. Does not radiate to the back. Denies history of similar symptoms in the past.  Past Medical History:  Diagnosis Date  . Allergy     Patient Active Problem List   Diagnosis Date Noted  . Elevated blood pressure 08/22/2015    Past Surgical History:  Procedure Laterality Date  . NO PAST SURGERIES         Home Medications    Prior to Admission medications   Medication Sig Start Date End Date Taking? Authorizing Provider  naproxen (NAPROSYN) 500 MG tablet Take 1 tablet (500 mg total) by mouth 2 (two) times daily. 05/01/16   Merryl Hacker, MD    Family History Family History  Problem Relation Age of Onset  . Alcohol abuse Father   . Alcohol abuse Mother   . Diabetes Mother   . Hyperlipidemia Maternal Aunt   . Heart disease Paternal Uncle   . Hypertension Paternal Uncle   . Alcohol abuse Paternal Grandfather   . Hyperlipidemia Paternal Grandfather   . Heart disease Paternal Grandfather   . Hypertension Paternal Grandfather   . Diabetes Paternal Grandfather   . Kidney disease Paternal Grandfather     Social History Social History  Substance Use Topics  . Smoking status: Former Smoker    Packs/day: 0.50    Types: Cigarettes    . Smokeless tobacco: Never Used  . Alcohol use No     Allergies   Review of patient's allergies indicates no known allergies.   Review of Systems Review of Systems  Constitutional: Negative for diaphoresis and fever.  Respiratory: Negative for cough and shortness of breath.   Cardiovascular: Positive for chest pain. Negative for palpitations and leg swelling.  Gastrointestinal: Negative for nausea and vomiting.  All other systems reviewed and are negative.    Physical Exam Updated Vital Signs BP 139/89   Pulse 64   Temp 98.4 F (36.9 C) (Oral)   Resp 15   Ht 5\' 8"  (1.727 m)   Wt 245 lb (111.1 kg)   SpO2 99%   BMI 37.25 kg/m   Physical Exam  Constitutional: He is oriented to person, place, and time. He appears well-developed and well-nourished.  HENT:  Head: Normocephalic and atraumatic.  Cardiovascular: Normal rate, regular rhythm and normal heart sounds.   No murmur heard. Pulmonary/Chest: Effort normal and breath sounds normal. No respiratory distress. He has no wheezes. He exhibits no tenderness.  Abdominal: Soft. Bowel sounds are normal. There is no tenderness. There is no rebound.  Musculoskeletal: He exhibits no edema.  Neurological: He is alert and oriented to person, place, and time.  Skin: Skin is warm and dry.  Psychiatric:  He has a normal mood and affect.  Nursing note and vitals reviewed.    ED Treatments / Results  Labs (all labs ordered are listed, but only abnormal results are displayed) Labs Reviewed  TROPONIN I  D-DIMER, QUANTITATIVE (NOT AT [facility])    EKG  EKG Interpretation  Date/Time:  Tuesday May 01 2016 02:05:35 EDT Ventricular Rate:  79 PR Interval:    QRS Duration: 83 QT Interval:  396 QTC Calculation: [id number] R Axis:   29 Text Interpretation:  Sinus rhythm Confirmed by Dina Rich  MD, Derrel Moore ([id number]) on 05/01/2016 2:08:46 AM       Radiology Dg Chest 2 View  Result Date: 05/01/2016 CLINICAL DATA:  40 year old male with chest  pain EXAM: CHEST  2 VIEW COMPARISON:  None. FINDINGS: The heart size and mediastinal contours are within normal limits. Both lungs are clear. The visualized skeletal structures are unremarkable. IMPRESSION: No active cardiopulmonary disease. Electronically Signed   By: Anner Crete M.D.   On: 05/01/2016 03:22   Procedures Procedures (including critical care time)  Medications Ordered in ED Medications  ketorolac (TORADOL) 30 MG/ML injection 30 mg (30 mg Intravenous Given 05/01/16 0250)     Initial Impression / Assessment and Plan / ED Course  I have reviewed the triage vital signs and the nursing notes.  Pertinent labs & imaging results that were available during my care of the patient were reviewed by me and considered in my medical decision making (see chart for details).  Clinical Course   This is a 40 year old male who presents with chest pain. Nontoxic. Vital signs notable for initial blood pressure 166/101.  EKG is nonischemic. Chest x-ray, troponin, and d-dimer are negative. Very atypical for ACS and he is low risk. Only risk factors hypertension. Denies association with food. He did have some improvement with Toradol. There could be a musculoskeletal or inflammatory component. Will discharge home with naproxen twice a day. Follow-up with primary physician and cardiology for further evaluation if symptoms persist.  After history, exam, and medical workup I feel the patient has been appropriately medically screened and is safe for discharge home. Pertinent diagnoses were discussed with the patient. Patient was given return precautions.    Final Clinical Impressions(s) / ED Diagnoses   Final diagnoses:  Nonspecific chest pain    New Prescriptions New Prescriptions   NAPROXEN (NAPROSYN) 500 MG TABLET    Take 1 tablet (500 mg total) by mouth 2 (two) times daily.     Merryl Hacker, MD 05/01/16 [phone]

## 2016-05-01 NOTE — Discharge Instructions (Signed)
You were seen today for chest pain. Your workup is reassuring. You may have inflammation in your chest or muscle pain. Naproxen twice a day for pain. If your symptoms persist or worsen you need follow-up with primary physician or cardiology. You may need further testing.

## 2016-05-01 NOTE — ED Notes (Signed)
Pt verbalizes understanding of d/c instructions and denies any further needs at this time. 

## 2016-05-01 NOTE — ED Triage Notes (Signed)
Pt c/o chest pain that started in the right and center chest yesterday and is focussed on the left side today.  Denies SOB, denies n/v, denies diaphoresis, denies jaw or arm or back pain.  Pt states the pain woke him out of his sleep, but is currently gone.

## 2016-05-01 NOTE — ED Notes (Signed)
Patient transported to X-ray 

## 2016-05-01 NOTE — ED Notes (Signed)
Pt returned from xray

## 2016-05-01 NOTE — ED Notes (Signed)
Attempted two IV sticks without success 

## 2018-04-29 ENCOUNTER — Encounter (HOSPITAL_COMMUNITY): Payer: Self-pay | Admitting: Emergency Medicine

## 2018-04-29 ENCOUNTER — Emergency Department (HOSPITAL_COMMUNITY)
Admission: EM | Admit: 2018-04-29 | Discharge: 2018-04-29 | Disposition: A | Discharge status: Home / Self Care | Payer: BC Managed Care – PPO | Attending: Emergency Medicine | Admitting: Emergency Medicine

## 2018-04-29 ENCOUNTER — Emergency Department (HOSPITAL_COMMUNITY): Payer: BC Managed Care – PPO

## 2018-04-29 ENCOUNTER — Other Ambulatory Visit: Payer: Self-pay

## 2018-04-29 DIAGNOSIS — K219 Gastro-esophageal reflux disease without esophagitis: Secondary | ICD-10-CM | POA: Insufficient documentation

## 2018-04-29 DIAGNOSIS — Z79899 Other long term (current) drug therapy: Secondary | ICD-10-CM | POA: Insufficient documentation

## 2018-04-29 DIAGNOSIS — F17210 Nicotine dependence, cigarettes, uncomplicated: Secondary | ICD-10-CM | POA: Insufficient documentation

## 2018-04-29 DIAGNOSIS — R0789 Other chest pain: Secondary | ICD-10-CM

## 2018-04-29 LAB — BASIC METABOLIC PANEL
ANION GAP: 8 (ref 5–15)
BUN: 13 mg/dL (ref 6–20)
CALCIUM: 9.2 mg/dL (ref 8.9–10.3)
CHLORIDE: 107 mmol/L (ref 98–111)
CO2: 24 mmol/L (ref 22–32)
Creatinine, Ser: 0.9 mg/dL (ref 0.61–1.24)
GFR calc Af Amer: >60 mL/min (ref >60)
GFR calc non Af Amer: >60 mL/min (ref >60)
GLUCOSE: 98 mg/dL (ref 70–99)
Potassium: 3.9 mmol/L (ref 3.5–5.1)
Sodium: 139 mmol/L (ref 135–145)

## 2018-04-29 LAB — CBC
HEMATOCRIT: 39.6 % (ref 39.0–52.0)
Hemoglobin: 13.3 g/dL (ref 13.0–17.0)
MCH: 31.1 pg (ref 26.0–34.0)
MCHC: 33.6 g/dL (ref 30.0–36.0)
MCV: 92.7 fL (ref 78.0–100.0)
Platelets: 240 10*3/uL (ref 150–400)
RBC: 4.27 MIL/uL (ref 4.22–5.81)
RDW: 11.9 % (ref 11.5–15.5)
WBC: 8.7 10*3/uL (ref 4.0–10.5)

## 2018-04-29 LAB — I-STAT TROPONIN, ED: TROPONIN I, POC: 0 ng/mL (ref 0.00–0.08)

## 2018-04-29 MED ORDER — METHOCARBAMOL 500 MG PO TABS: 1000.0000 mg | ORAL_TABLET | Freq: Three times a day (TID) | ORAL | 0 refills | Status: DC | PRN

## 2018-04-29 MED ORDER — FAMOTIDINE 20 MG PO TABS: 20.0000 mg | ORAL_TABLET | Freq: Two times a day (BID) | ORAL | 0 refills | Status: DC

## 2018-04-29 MED ORDER — METHOCARBAMOL 500 MG PO TABS
1000.0000 mg | ORAL_TABLET | Freq: Once | ORAL | Status: AC
  Administered 2018-04-29: 1000 mg via ORAL
  Filled 2018-04-29: qty 2

## 2018-04-29 MED ORDER — KETOROLAC TROMETHAMINE 60 MG/2ML IM SOLN
60.0000 mg | Freq: Once | INTRAMUSCULAR | Status: AC
  Administered 2018-04-29: 60 mg via INTRAMUSCULAR
  Filled 2018-04-29: qty 2

## 2018-04-29 NOTE — ED Triage Notes (Signed)
Pt has had dull aching pain across his chest for the past week, intermittent,. Hard to sleep. Also an episode of dizziness and diaphoresis last week.

## 2018-04-29 NOTE — ED Provider Notes (Signed)
[facility] EMERGENCY DEPARTMENT Provider Note   CSN: [id number] Arrival date & time: 04/29/18  5093     History   Chief Complaint Chief Complaint  Patient presents with  . Chest Pain    HPI Dylan Webster is a 42 y.o. male.  HPI Patient presents with 6 days of left-sided chest pain.  States the pain has become constant for the past 2 days.  Is making it difficult to sleep.  States he occasionally has sharp pains that come and go but that a goal constant pain is currently present.  Worse with movement and palpation.  Patient states he does a good amount of heavy lifting but does not remember any injury.  Also reports pain into the upper part of his left arm and the left side of his neck and thoracic back.  Denies any shortness of breath associated with this.  States he has been taking ibuprofen and Tylenol.  He is having some reflux symptoms associated with this which she describes as burning sensation in his upper abdomen and lower chest.  Has several family members who have had heart attacks in the past but no first-degree relatives.  No recent extended travel or immobilization.  No calf swelling or pain. Past Medical History:  Diagnosis Date  . Allergy     Patient Active Problem List   Diagnosis Date Noted  . Elevated blood pressure 08/22/2015    Past Surgical History:  Procedure Laterality Date  . NO PAST SURGERIES          Home Medications    Prior to Admission medications   Medication Sig Start Date End Date Taking? Authorizing Provider  acetaminophen (TYLENOL) 500 MG tablet Take 500-1,000 mg by mouth every 6 (six) hours as needed for mild pain.   Yes [provider]  ibuprofen (ADVIL,MOTRIN) 200 MG tablet Take 600 mg by mouth every 6 (six) hours as needed for moderate pain.   Yes [provider]  famotidine (PEPCID) 20 MG tablet Take 1 tablet (20 mg total) by mouth 2 (two) times daily. 04/29/18   Julianne Rice, MD    methocarbamol (ROBAXIN) 500 MG tablet Take 2 tablets (1,000 mg total) by mouth every 8 (eight) hours as needed. 04/29/18   Julianne Rice, MD  naproxen (NAPROSYN) 500 MG tablet Take 1 tablet (500 mg total) by mouth 2 (two) times daily. Patient not taking: Reported on 04/29/2018 05/01/16   Horton, Barbette Hair, MD    Family History Family History  Problem Relation Age of Onset  . Alcohol abuse Father   . Alcohol abuse Mother   . Diabetes Mother   . Hyperlipidemia Maternal Aunt   . Heart disease Paternal Uncle   . Hypertension Paternal Uncle   . Alcohol abuse Paternal Grandfather   . Hyperlipidemia Paternal Grandfather   . Heart disease Paternal Grandfather   . Hypertension Paternal Grandfather   . Diabetes Paternal Grandfather   . Kidney disease Paternal Grandfather     Social History Social History   Tobacco Use  . Smoking status: Former Smoker    Packs/day: 0.50    Types: Cigarettes  . Smokeless tobacco: Never Used  Substance Use Topics  . Alcohol use: No  . Drug use: No     Allergies   Patient has no known allergies.   Review of Systems Review of Systems  Constitutional: Negative for chills and fever.  HENT: Negative for sore throat and trouble swallowing.   Eyes: Negative for visual  disturbance.  Respiratory: Negative for cough and shortness of breath.   Cardiovascular: Positive for chest pain. Negative for palpitations and leg swelling.  Gastrointestinal: Positive for abdominal pain and nausea. Negative for constipation, diarrhea and vomiting.  Genitourinary: Negative for flank pain.  Musculoskeletal: Positive for back pain, myalgias and neck pain. Negative for neck stiffness.  Skin: Negative for rash and wound.  Neurological: Positive for light-headedness. Negative for weakness, numbness and headaches.  All other systems reviewed and are negative.    Physical Exam Updated Vital Signs BP (!) 165/102   Pulse 83   Temp 98.2 F (36.8 C) (Oral)   Resp 10    SpO2 95%   Physical Exam  Constitutional: He is oriented to person, place, and time. He appears well-developed and well-nourished. No distress.  HENT:  Head: Normocephalic and atraumatic.  Mouth/Throat: Oropharynx is clear and moist. No oropharyngeal exudate.  Eyes: Pupils are equal, round, and reactive to light. EOM are normal.  Neck: Normal range of motion. Neck supple. No JVD present.  Cardiovascular: Normal rate and regular rhythm. Exam reveals no gallop and no friction rub.  No murmur heard. Pulmonary/Chest: Effort normal and breath sounds normal. No stridor. No respiratory distress. He has no wheezes. He has no rales. He exhibits tenderness.  Chest pain is reproduced with palpation of the left pectoralis muscle.  There is no crepitance or deformity.  Abdominal: Soft. Bowel sounds are normal. There is no tenderness. There is no rebound and no guarding.  Musculoskeletal: Normal range of motion. He exhibits no edema or tenderness.  No lower extremity swelling, asymmetry or tenderness.  Patient does have some mild tenderness to palpation over the insertion of the pectoralis into the left upper arm and the left bicep.  Distal pulses are 2+.  Full range of motion of all joints without obvious deformity, effusion or warmth.  No midline thoracic or lumbar tenderness.  Patient does have some tenderness to the left trapezius and left upper thoracic back musculature.  Lymphadenopathy:    He has no cervical adenopathy.  Neurological: He is alert and oriented to person, place, and time.  5/5 motor in all extremities.  Sensation fully intact.  Skin: Skin is warm and dry. Capillary refill takes less than 2 seconds. No rash noted. He is not diaphoretic. No erythema.  Psychiatric: He has a normal mood and affect. His behavior is normal.  Nursing note and vitals reviewed.    ED Treatments / Results  Labs (all labs ordered are listed, but only abnormal results are displayed) Labs Reviewed  BASIC  METABOLIC PANEL  CBC  I-STAT TROPONIN, ED    EKG EKG Interpretation  Date/Time:  Tuesday April 29 2018 06:57:02 EDT Ventricular Rate:  82 PR Interval:    QRS Duration: 83 QT Interval:  369 QTC Calculation: 431 R Axis:   21 Text Interpretation:  Sinus rhythm Normal ECG Confirmed by Veryl Speak [phone]) on 04/29/2018 7:00:02 AM Also confirmed by Veryl Speak [phone]), editor Lynder Parents [phone])  on 04/29/2018 7:44:10 AM   Radiology Dg Chest 2 View  Result Date: 04/29/2018 CLINICAL DATA:  Mid and left-sided chest pain intermittently for the past 6 days with some left arm discomfort. One day of cough. Former smoker. EXAM: CHEST - 2 VIEW COMPARISON:  PA and lateral chest x-ray of May 01, 2016 FINDINGS: The lungs are adequately inflated. The interstitial markings are mildly prominent though stable. There is no alveolar infiltrate or pleural effusion. The heart and pulmonary vascularity are normal.  The mediastinum is normal in width. The trachea is midline. The bony thorax is unremarkable. IMPRESSION: There is no acute cardiopulmonary abnormality. Stable mild chronic bronchitic-smoking related interstitial changes. Electronically Signed   By: Mone Commisso  [place] M.D.   On: 04/29/2018 07:26    Procedures Procedures (including critical care time)  Medications Ordered in ED Medications  ketorolac (TORADOL) injection 60 mg (60 mg Intramuscular Given 04/29/18 0807)  methocarbamol (ROBAXIN) tablet 1,000 mg (1,000 mg Oral Given 04/29/18 0807)     Initial Impression / Assessment and Plan / ED Course  I have reviewed the triage vital signs and the nursing notes.  Pertinent labs & imaging results that were available during my care of the patient were reviewed by me and considered in my medical decision making (see chart for details).    Symptoms consistent with muscular chest wall pain.  Likely pectoralis strain.  Patient is also having symptoms of GERD.  Likely due to recent increased use of  ibuprofen.  To take ibuprofen with food and decreased dose if he continues to have reflux symptoms.  Will start on Pepcid.  Very low suspicion for CAD.  EKG troponin are normal.  Advised to follow-up with primary physician and return precautions given.   Final Clinical Impressions(s) / ED Diagnoses   Final diagnoses:  Chest wall pain  Gastroesophageal reflux disease without esophagitis    ED Discharge Orders        Ordered    famotidine (PEPCID) 20 MG tablet  2 times daily     04/29/18 0807    methocarbamol (ROBAXIN) 500 MG tablet  Every 8 hours PRN     04/29/18 0807       Julianne Rice, MD 04/29/18 [phone]

## 2019-01-28 ENCOUNTER — Telehealth: Payer: Self-pay | Admitting: Internal Medicine

## 2019-01-28 NOTE — Telephone Encounter (Signed)
Best number [phone]  Spouse Caryl Pina) called to schedule new patient appointment pt saw dr Erline Levine burns 08/2015.  Is it ok to schedule a virtual transfer appointment with you.

## 2019-01-28 NOTE — Telephone Encounter (Signed)
yes

## 2019-01-30 NOTE — Telephone Encounter (Signed)
Tried calling - mailbox full...

## 2019-02-02 NOTE — Telephone Encounter (Signed)
Appointment 4/28 Pt aware

## 2019-02-03 ENCOUNTER — Encounter: Payer: Self-pay | Admitting: Internal Medicine

## 2019-02-03 ENCOUNTER — Ambulatory Visit (INDEPENDENT_AMBULATORY_CARE_PROVIDER_SITE_OTHER): Payer: BC Managed Care – PPO | Admitting: Internal Medicine

## 2019-02-03 DIAGNOSIS — R0683 Snoring: Secondary | ICD-10-CM | POA: Diagnosis not present

## 2019-02-03 DIAGNOSIS — Z8249 Family history of ischemic heart disease and other diseases of the circulatory system: Secondary | ICD-10-CM | POA: Diagnosis not present

## 2019-02-03 DIAGNOSIS — Z833 Family history of diabetes mellitus: Secondary | ICD-10-CM | POA: Diagnosis not present

## 2019-02-03 NOTE — Patient Instructions (Signed)

## 2019-02-03 NOTE — Progress Notes (Signed)
Virtual Visit via Video Note  I connected with Dylan Webster on 02/03/19 at  2:30 PM EDT by a video enabled telemedicine application and verified that I am speaking with the correct person using two identifiers.   I discussed the limitations of evaluation and management by telemedicine and the availability of in person appointments. The patient expressed understanding and agreed to proceed.  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 He has not had a PCP in many years.  He would like a referral for a sleep study. He reports his wife tells him that he snores, stops breathing and wakes up choking at night. Sometimes he feels rested when he wakes up, some times he doesn't.  He would also like to get a full panel of labwork to check his overall health.   Flu: never Tetanus: unsure Vision Screening: annually Dentist: as needed    Past Medical History:  Diagnosis Date  . Allergy     Current Outpatient Medications  Medication Sig Dispense Refill  . ibuprofen (ADVIL,MOTRIN) 200 MG tablet Take 800 mg by mouth daily.      No current facility-administered medications for this visit.     No Known Allergies  Family History  Problem Relation Age of Onset  . Alcohol abuse Father   . Heart disease Father   . Alcohol abuse Mother   . Diabetes Mother   . Hyperlipidemia Maternal Aunt   . Heart disease Paternal Uncle   . Hypertension Paternal Uncle   . Alcohol abuse Paternal Grandfather   . Hyperlipidemia Paternal Grandfather   . Heart disease Paternal Grandfather   . Hypertension Paternal Grandfather   . Diabetes Paternal Grandfather   . Kidney disease Paternal Grandfather   . Leukemia Maternal Aunt     Social History   Socioeconomic History  . Marital status: Single    Spouse name: Not on file  . Number of children: Not on file  . Years of education: Not on file  . Highest education level:  Not on file  Occupational History  . Not on file  Social Needs  . Financial resource strain: Not on file  . Food insecurity:    Worry: Not on file    Inability: Not on file  . Transportation needs:    Medical: Not on file    Non-medical: Not on file  Tobacco Use  . Smoking status: Former Smoker    Packs/day: 0.50    Types: Cigarettes  . Smokeless tobacco: Never Used  . Tobacco comment: quit 12/2018  Substance and Sexual Activity  . Alcohol use: No  . Drug use: No  . Sexual activity: Yes    Birth control/protection: None  Lifestyle  . Physical activity:    Days per week: Not on file    Minutes per session: Not on file  . Stress: Not on file  Relationships  . Social connections:    Talks on phone: Not on file    Gets together: Not on file    Attends religious service: Not on file    Active member of club or organization: Not on file    Attends meetings of clubs or organizations: Not on file    Relationship status: Not on file  . Intimate partner violence:    Fear of current or ex partner: Not on file    Emotionally abused: Not on file    Physically abused: Not on  file    Forced sexual activity: Not on file  Other Topics Concern  . Not on file  Social History Narrative   ** Merged History Encounter **       Married, 4 children  Job: [employer]  Will start exercising regularly     Constitutional: Denies fever, malaise, fatigue, headache or abrupt weight changes.  HEENT: Denies eye pain, eye redness, ear pain, ringing in the ears, wax buildup, runny nose, nasal congestion, bloody nose, or sore throat. Respiratory: Denies difficulty breathing, shortness of breath, cough or sputum production.   Cardiovascular: Denies chest pain, chest tightness, palpitations or swelling in the hands or feet.  Gastrointestinal: Denies abdominal pain, bloating, constipation, diarrhea or blood in the stool.  GU: Denies urgency, frequency, pain with urination, burning  sensation, blood in urine, odor or discharge. Musculoskeletal: Denies decrease in range of motion, difficulty with gait, muscle pain or joint pain and swelling.  Skin: Denies redness, rashes, lesions or ulcercations.  Neurological: Pt reports snoring. Denies dizziness, difficulty with memory, difficulty with speech or problems with balance and coordination.  Psych: Denies anxiety, depression, SI/HI.  No other specific complaints in a complete review of systems (except as listed in HPI above).  Wt Readings from Last 3 Encounters:  05/01/16 245 lb (111.1 kg)  08/22/15 236 lb (107 kg)  09/14/13 188 lb (85.3 kg)    General: Appears his stated age, obese, in NAD. Skin: Dry and intact. HEENT: Head: normal shape and size; Eyes: sclera white,  EOMs intact;  Pulmonary/Chest: Normal effort. No respiratory distress.  Neurological: Alert and oriented.  Psychiatric: Mood and affect normal. Behavior is normal. Judgment and thought content normal.     BMET    Component Value Date/Time   NA 139 04/29/2018 0659   K 3.9 04/29/2018 0659   CL 107 04/29/2018 0659   CO2 24 04/29/2018 0659   GLUCOSE 98 04/29/2018 0659   BUN 13 04/29/2018 0659   CREATININE 0.90 04/29/2018 0659   CALCIUM 9.2 04/29/2018 0659   GFRNONAA >60 04/29/2018 0659   GFRAA >60 04/29/2018 0659    Lipid Panel  No results found for: CHOL, TRIG, HDL, CHOLHDL, VLDL, LDLCALC  CBC    Component Value Date/Time   WBC 8.7 04/29/2018 0659   RBC 4.27 04/29/2018 0659   HGB 13.3 04/29/2018 0659   HCT 39.6 04/29/2018 0659   PLT 240 04/29/2018 0659   MCV 92.7 04/29/2018 0659   MCH 31.1 04/29/2018 0659   MCHC 33.6 04/29/2018 0659   RDW 11.9 04/29/2018 0659   LYMPHSABS 3.4 05/24/2011 0613   MONOABS 0.8 05/24/2011 0613   EOSABS 0.2 05/24/2011 0613   BASOSABS 0.0 05/24/2011 0613    Hgb A1C No results found for: HGBA1C       Assessment and Plan:  Snoring:  Discussed how weight loss could help improve OSA Referral to  pulmonology to set up sleep study  Family History of HLD, D M2:  Will check CBC, CEMT, Lipid and A1C  Follow Up Instructions:    I discussed the assessment and treatment plan with the patient. The patient was provided an opportunity to ask questions and all were answered. The patient agreed with the plan and demonstrated an understanding of the instructions.   The patient was advised to call back or seek an in-person evaluation if the symptoms worsen or if the condition fails to improve as anticipated.     Webb Silversmith, NP

## 2019-02-05 ENCOUNTER — Telehealth: Payer: Self-pay

## 2019-02-05 ENCOUNTER — Other Ambulatory Visit (INDEPENDENT_AMBULATORY_CARE_PROVIDER_SITE_OTHER): Payer: BC Managed Care – PPO

## 2019-02-05 ENCOUNTER — Other Ambulatory Visit: Payer: Self-pay

## 2019-02-05 DIAGNOSIS — Z833 Family history of diabetes mellitus: Secondary | ICD-10-CM

## 2019-02-05 DIAGNOSIS — R0683 Snoring: Secondary | ICD-10-CM

## 2019-02-05 DIAGNOSIS — Z8249 Family history of ischemic heart disease and other diseases of the circulatory system: Secondary | ICD-10-CM

## 2019-02-05 LAB — COMPREHENSIVE METABOLIC PANEL
ALT: 21 U/L (ref 0–53)
AST: 19 U/L (ref 0–37)
Albumin: 4.2 g/dL (ref 3.5–5.2)
Alkaline Phosphatase: 77 U/L (ref 39–117)
BUN: 15 mg/dL (ref 6–23)
CO2: 27 mEq/L (ref 19–32)
Calcium: 8.8 mg/dL (ref 8.4–10.5)
Chloride: 105 mEq/L (ref 96–112)
Creatinine, Ser: 0.89 mg/dL (ref 0.40–1.50)
GFR: 113.25 mL/min (ref >60.00)
Glucose, Bld: 95 mg/dL (ref 70–99)
Potassium: 4.3 mEq/L (ref 3.5–5.1)
Sodium: 140 mEq/L (ref 135–145)
Total Bilirubin: 0.7 mg/dL (ref 0.2–1.2)
Total Protein: 6.9 g/dL (ref 6.0–8.3)

## 2019-02-05 LAB — LIPID PANEL
Cholesterol: 183 mg/dL (ref 0–200)
HDL: 43.2 mg/dL (ref >39.00)
LDL Cholesterol: 126 mg/dL — ABNORMAL HIGH (ref 0–99)
NonHDL: 140.07
Total CHOL/HDL Ratio: 4
Triglycerides: 72 mg/dL (ref 0.0–149.0)
VLDL: 14.4 mg/dL (ref 0.0–40.0)

## 2019-02-05 LAB — CBC
HCT: 38.1 % — ABNORMAL LOW (ref 39.0–52.0)
Hemoglobin: 13.3 g/dL (ref 13.0–17.0)
MCHC: 34.9 g/dL (ref 30.0–36.0)
MCV: 91.8 fl (ref 78.0–100.0)
Platelets: 275 10*3/uL (ref 150.0–400.0)
RBC: 4.15 Mil/uL — ABNORMAL LOW (ref 4.22–5.81)
RDW: 12.2 % (ref 11.5–15.5)
WBC: 7.7 10*3/uL (ref 4.0–10.5)

## 2019-02-05 LAB — HEMOGLOBIN A1C: Hgb A1c MFr Bld: 4.9 % (ref 4.6–6.5)

## 2019-02-05 NOTE — Telephone Encounter (Signed)
[facility] Night - Client Nonclinical Telephone Record [facility] Night - Client Client Site [facility] - Night Physician [facility] - PHYSICIAN, Verita Schneiders- MD Contact Type Call Who Is Calling Patient / Member / Family / Caregiver Caller Name Sidhant Helderman Caller Phone Number [phone] Patient Name Dylan Webster Patient DOB 1976-04-11 Call Type Message Only Information Provided Reason for Call Request for General Office Information Initial Comment Caller states is scheduled an appointment and the entrance is blocked to get lab drawn.

## 2019-02-05 NOTE — Telephone Encounter (Signed)
I spoke with Dylan Webster in lab and pt came to front to have blood drawn; nothing further needed.

## 2019-02-12 ENCOUNTER — Encounter: Payer: Self-pay | Admitting: Internal Medicine

## 2019-02-12 ENCOUNTER — Ambulatory Visit (INDEPENDENT_AMBULATORY_CARE_PROVIDER_SITE_OTHER): Payer: BC Managed Care – PPO | Admitting: Internal Medicine

## 2019-02-12 ENCOUNTER — Other Ambulatory Visit: Payer: Self-pay

## 2019-02-12 VITALS — BP 140/80 | HR 92 | Ht 68.75 in | Wt 244.4 lb

## 2019-02-12 DIAGNOSIS — G4733 Obstructive sleep apnea (adult) (pediatric): Secondary | ICD-10-CM

## 2019-02-12 DIAGNOSIS — R0683 Snoring: Secondary | ICD-10-CM | POA: Diagnosis not present

## 2019-02-12 DIAGNOSIS — I10 Essential (primary) hypertension: Secondary | ICD-10-CM | POA: Diagnosis not present

## 2019-02-12 NOTE — Progress Notes (Signed)
[id number]- 59 yoM former smoker, married, Architectural technologist, for sleep evaluation referred by PCP for snoring, wife reports coughing & choking in middle of night, never done sleep study Medical problems include HBP, Hypercholesterolemia, Hx Cancer He gives hx of snoring, witnessed apneas. No ENT surgery, lung or heart problems.  Family OSA- father and brother Body weight today 244 lbs Weight increase when he quit smoking. Some daytime sleepiness.. 1 caffeine tab in the morning- doesn't like coffee. Bedtime 10:30, waking 2-3 times before up 5:30 AM.   Prior to Admission medications   Medication Sig Start Date End Date Taking? Authorizing Provider  acetaminophen (TYLENOL) 325 MG tablet Take 650 mg by mouth as needed.   Yes [provider]  caffeine 200 MG TABS tablet Take 200 mg by mouth daily.   Yes [provider]  ibuprofen (ADVIL,MOTRIN) 200 MG tablet Take 800 mg by mouth daily as needed for moderate pain.    Yes [provider]   Past Medical History:  Diagnosis Date  . Allergy    Past Surgical History:  Procedure Laterality Date  . NO PAST SURGERIES     Family History  Problem Relation Age of Onset  . Alcohol abuse Father   . Heart disease Father   . Alcohol abuse Mother   . Diabetes Mother   . Hyperlipidemia Maternal Aunt   . Heart disease Paternal Uncle   . Hypertension Paternal Uncle   . Alcohol abuse Paternal Grandfather   . Hyperlipidemia Paternal Grandfather   . Heart disease Paternal Grandfather   . Hypertension Paternal Grandfather   . Diabetes Paternal Grandfather   . Kidney disease Paternal Grandfather   . Leukemia Maternal Aunt    Social History   Socioeconomic History  . Marital status: Single    Spouse name: Not on file  . Number of children: Not on file  . Years of education: Not on file  . Highest education level: Not on file  Occupational History  . Not on file  Social Needs  . Financial resource strain: Not on file  .  Food insecurity:    Worry: Not on file    Inability: Not on file  . Transportation needs:    Medical: Not on file    Non-medical: Not on file  Tobacco Use  . Smoking status: Former Smoker    Packs/day: 0.50    Types: Cigarettes    Last attempt to quit: 12/22/2018    Years since quitting: 0.1  . Smokeless tobacco: Never Used  . Tobacco comment: quit 12/2018  Substance and Sexual Activity  . Alcohol use: No  . Drug use: No  . Sexual activity: Yes    Birth control/protection: None  Lifestyle  . Physical activity:    Days per week: Not on file    Minutes per session: Not on file  . Stress: Not on file  Relationships  . Social connections:    Talks on phone: Not on file    Gets together: Not on file    Attends religious service: Not on file    Active member of club or organization: Not on file    Attends meetings of clubs or organizations: Not on file    Relationship status: Not on file  . Intimate partner violence:    Fear of current or ex partner: Not on file    Emotionally abused: Not on file    Physically abused: Not on file    Forced sexual activity: Not on file  Other Topics Concern  . Not on file  Social History Narrative   ** Merged History Encounter **       Married, 4 children  Job: [employer]  Will start exercising regularly   ROS-see HPI   + = positive Constitutional:    weight loss, night sweats, fevers, chills,+ fatigue, lassitude. HEENT:    headaches, difficulty swallowing, tooth/dental problems, sore throat,       sneezing, itching, ear ache, nasal congestion, post nasal drip, snoring CV:    chest pain, orthopnea, PND, swelling in lower extremities, anasarca,                                  dizziness, palpitations Resp:   shortness of breath with exertion or at rest.                productive cough,   non-productive cough, coughing up of blood.              change in color of mucus.  wheezing.   Skin:    rash or lesions. GI:  No-    heartburn, indigestion, abdominal pain, nausea, vomiting, diarrhea,                 change in bowel habits, loss of appetite GU: dysuria, change in color of urine, no urgency or frequency.   flank pain. MS:   joint pain, stiffness, decreased range of motion, back pain. Neuro-     nothing unusual Psych:  change in mood or affect.  depression or anxiety.   memory loss.  OBJ- Physical Exam General- Alert, Oriented, Affect-appropriate, Distress- none acute, + obese Skin- rash-none, lesions- none, excoriation- none Lymphadenopathy- none Head- atraumatic            Eyes- Gross vision intact, PERRLA, conjunctivae and secretions clear            Ears- Hearing, canals-normal            Nose- Clear, no-Septal dev, mucus, polyps, erosion, perforation             Throat- Mallampati IV, mucosa clear , drainage- none, tonsils- atrophic, + teeth Neck- flexible , trachea midline, no stridor , thyroid nl, carotid no bruit Chest - symmetrical excursion , unlabored           Heart/CV- RRR , no murmur , no gallop  , no rub, nl s1 s2                           - JVD- none , edema- none, stasis changes- none, varices- none           Lung- clear to P&A, wheeze- none, cough- none , dullness-none, rub- none           Chest wall-  Abd-  Br/ Gen/ Rectal- Not done, not indicated Extrem- cyanosis- none, clubbing, none, atrophy- none, strength- nl Neuro- grossly intact to observation

## 2019-02-12 NOTE — Patient Instructions (Signed)
Order- schedule unattended home sleep test    Dx OSA  Please call me about 2 weeks after your sleep test, for results and recommendations.If appropriate, we may be able to start treatment before we see you next.  Please cal as needed

## 2019-02-24 DIAGNOSIS — R0683 Snoring: Secondary | ICD-10-CM | POA: Insufficient documentation

## 2019-02-24 DIAGNOSIS — I10 Essential (primary) hypertension: Secondary | ICD-10-CM | POA: Insufficient documentation

## 2019-02-24 NOTE — Assessment & Plan Note (Signed)
High probability for OSA, based on history and exam. We discussed sleep hygiene, physiology, medical concerns and treatments for OSA and responsibility for safe driving.  Plan- schedule sleep study. CPAP likely first choice if appropriate.

## 2019-02-24 NOTE — Assessment & Plan Note (Signed)
Managed by his PCP

## 2019-03-27 ENCOUNTER — Other Ambulatory Visit (HOSPITAL_COMMUNITY): Payer: BC Managed Care – PPO

## 2019-03-30 ENCOUNTER — Other Ambulatory Visit (HOSPITAL_COMMUNITY)
Admission: RE | Admit: 2019-03-30 | Discharge: 2019-03-30 | Disposition: A | Discharge status: Home / Self Care | Payer: BC Managed Care – PPO | Source: Ambulatory Visit | Attending: Internal Medicine | Admitting: Internal Medicine

## 2019-03-30 DIAGNOSIS — Z1159 Encounter for screening for other viral diseases: Secondary | ICD-10-CM | POA: Insufficient documentation

## 2019-03-30 LAB — SARS CORONAVIRUS 2 (TAT 6-24 HRS): SARS Coronavirus 2: NEGATIVE

## 2019-04-01 ENCOUNTER — Ambulatory Visit (HOSPITAL_BASED_OUTPATIENT_CLINIC_OR_DEPARTMENT_OTHER): Payer: BC Managed Care – PPO | Attending: Internal Medicine | Admitting: Internal Medicine

## 2019-04-01 ENCOUNTER — Other Ambulatory Visit: Payer: Self-pay

## 2019-04-01 DIAGNOSIS — G4733 Obstructive sleep apnea (adult) (pediatric): Secondary | ICD-10-CM | POA: Insufficient documentation

## 2019-04-04 DIAGNOSIS — G4733 Obstructive sleep apnea (adult) (pediatric): Secondary | ICD-10-CM

## 2019-04-04 NOTE — Procedures (Signed)
   Patient Name: Dylan Webster, Dylan Webster Date: 04/01/2019 Gender: Male D.O.B: 12/22/1975 Age (years): 76 Referring Provider: Baird Lyons MD, ABSM Height (inches): 68 Interpreting Physician: Baird Lyons MD, ABSM Weight (lbs): 244 RPSGT: Carolin Coy BMI: 37 MRN: [id number] Neck Size: 18.00  CLINICAL INFORMATION Sleep Study Type: NPSG Indication for sleep study: Daytime Fatigue, Fatigue, Obesity, Snoring Epworth Sleepiness Score: 15  SLEEP STUDY TECHNIQUE As per the AASM Manual for the Scoring of Sleep and Associated Events v2.3 (April 2016) with a hypopnea requiring 4% desaturations.  The channels recorded and monitored were frontal, central and occipital EEG, electrooculogram (EOG), submentalis EMG (chin), nasal and oral airflow, thoracic and abdominal wall motion, anterior tibialis EMG, snore microphone, electrocardiogram, and pulse oximetry.  MEDICATIONS Medications self-administered by patient taken the night of the study : none reported  SLEEP ARCHITECTURE The study was initiated at 10:55:21 PM and ended at 4:53:26 AM.  Sleep onset time was 3.5 minutes and the sleep efficiency was 76.0%%. The total sleep time was 272 minutes.  Stage REM latency was N/A minutes.  The patient spent 23.3%% of the night in stage N1 sleep, 76.7%% in stage N2 sleep, 0.0%% in stage N3 and 0% in REM.  Alpha intrusion was absent.  Supine sleep was 33.23%.  RESPIRATORY PARAMETERS The overall apnea/hypopnea index (AHI) was 93.3 per hour. There were 348 total apneas, including 286 obstructive, 7 central and 55 mixed apneas. There were 75 hypopneas and 39 RERAs.  The AHI during Stage REM sleep was N/A per hour.  AHI while supine was 117.5 per hour.  The mean oxygen saturation was 91.7%. The minimum SpO2 during sleep was 75.0%.  loud snoring was noted during this study.  CARDIAC DATA The 2 lead EKG demonstrated sinus rhythm. The mean heart rate was 69.4 beats per minute. Other EKG findings  include: None.  LEG MOVEMENT DATA The total PLMS were 0 with a resulting PLMS index of 0.0. Associated arousal with leg movement index was 0.0 .  IMPRESSIONS - Severe obstructive sleep apnea occurred during this study (AHI = 93.3/h). - No significant central sleep apnea occurred during this study (CAI = 1.5/h). - Oxygen desaturation was noted during this study (Min O2 = 75.0%). - The patient snored with loud snoring volume. - No cardiac abnormalities were noted during this study. - Clinically significant periodic limb movements did not occur during sleep. No significant associated arousals.  DIAGNOSIS - Obstructive Sleep Apnea (327.23 [G47.33 ICD-10])  RECOMMENDATIONS - Suggest CPAP autopap or CPAP titration sleep study. Other options would be based on clinical judgment. - Positional therapy avoiding supine position during sleep. - Be careful with alcohol, sedatives and other CNS depressants that may worsen sleep apnea and disrupt normal sleep architecture. - Sleep hygiene should be reviewed to assess factors that may improve sleep quality. - Weight management and regular exercise should be initiated or continued if appropriate.  [Electronically signed] 04/04/2019 12:25 PM  Baird Lyons MD, [place], American Board of Sleep Medicine   NPI: [id number]                          [place]  ELECTRONICALLY SIGNED ON:  04/04/2019, 12:23 PM [place] PH: [phone]   FX: [phone] [place]

## 2019-05-15 ENCOUNTER — Ambulatory Visit: Payer: BC Managed Care – PPO | Admitting: Adult Health

## 2019-05-19 ENCOUNTER — Ambulatory Visit: Payer: BC Managed Care – PPO | Admitting: Adult Health

## 2019-05-19 ENCOUNTER — Other Ambulatory Visit: Payer: Self-pay

## 2019-05-19 ENCOUNTER — Encounter: Payer: Self-pay | Admitting: Adult Health

## 2019-05-19 VITALS — BP 134/84 | HR 82 | Temp 98.1°F | Ht 68.0 in | Wt 244.8 lb

## 2019-05-19 DIAGNOSIS — G4733 Obstructive sleep apnea (adult) (pediatric): Secondary | ICD-10-CM | POA: Diagnosis not present

## 2019-05-19 NOTE — Assessment & Plan Note (Signed)
Severe sleep apnea-patient education given.  Treatment options including CPAP and weight loss discussed.  Patient like to proceed with CPAP therapy  Plan  Begin nocturnal CPAP with auto titration 5 to 15 cm H2O. Goal was to wear CPAP greater than 4 to 6 hours. Healthy weight. Caution advised around driving when drowsy Follow-up 6 to 8 weeks with CPAP download

## 2019-05-19 NOTE — Patient Instructions (Addendum)
Begin his CPAP at bedtime Goal was to wear CPAP for at least 4 to 6 hours each night the longer you wear this the better Work on healthy weight Do not drive if  sleepy Follow-up in 2 months with Dr. Annamaria Boots or [place] NP and As needed    Sleep Apnea Sleep apnea affects breathing during sleep. It causes breathing to stop for a short time or to become shallow. It can also increase the risk of:  Heart attack.  Stroke.  Being very overweight (obese).  Diabetes.  Heart failure.  Irregular heartbeat. The goal of treatment is to help you breathe normally again. What are the causes? There are three kinds of sleep apnea:  Obstructive sleep apnea. This is caused by a blocked or collapsed airway.  Central sleep apnea. This happens when the brain does not send the right signals to the muscles that control breathing.  Mixed sleep apnea. This is a combination of obstructive and central sleep apnea. The most common cause of this condition is a collapsed or blocked airway. This can happen if:  Your throat muscles are too relaxed.  Your tongue and tonsils are too large.  You are overweight.  Your airway is too small. What increases the risk?  Being overweight.  Smoking.  Having a small airway.  Being older.  Being male.  Drinking alcohol.  Taking medicines to calm yourself (sedatives or tranquilizers).  Having family members with the condition. What are the signs or symptoms?  Trouble staying asleep.  Being sleepy or tired during the day.  Getting angry a lot.  Loud snoring.  Headaches in the morning.  Not being able to focus your mind (concentrate).  Forgetting things.  Less interest in sex.  Mood swings.  Personality changes.  Feelings of sadness (depression).  Waking up a lot during the night to pee (urinate).  Dry mouth.  Sore throat. How is this diagnosed?  Your medical history.  A physical exam.  A test that is done when you are sleeping  (sleep study). The test is most often done in a sleep lab but may also be done at home. How is this treated?   Sleeping on your side.  Using a medicine to get rid of mucus in your nose (decongestant).  Avoiding the use of alcohol, medicines to help you relax, or certain pain medicines (narcotics).  Losing weight, if needed.  Changing your diet.  Not smoking.  Using a machine to open your airway while you sleep, such as: ? An oral appliance. This is a mouthpiece that shifts your lower jaw forward. ? A CPAP device. This device blows air through a mask when you breathe out (exhale). ? An EPAP device. This has valves that you put in each nostril. ? A BPAP device. This device blows air through a mask when you breathe in (inhale) and breathe out.  Having surgery if other treatments do not work. It is important to get treatment for sleep apnea. Without treatment, it can lead to:  High blood pressure.  Coronary artery disease.  In men, not being able to have an erection (impotence).  Reduced thinking ability. Follow these instructions at home: Lifestyle  Make changes that your doctor recommends.  Eat a healthy diet.  Lose weight if needed.  Avoid alcohol, medicines to help you relax, and some pain medicines.  Do not use any products that contain nicotine or tobacco, such as cigarettes, e-cigarettes, and chewing tobacco. If you need help quitting, ask your  doctor. General instructions  Take over-the-counter and prescription medicines only as told by your doctor.  If you were given a machine to use while you sleep, use it only as told by your doctor.  If you are having surgery, make sure to tell your doctor you have sleep apnea. You may need to bring your device with you.  Keep all follow-up visits as told by your doctor. This is important. Contact a doctor if:  The machine that you were given to use during sleep bothers you or does not seem to be working.  You do not  get better.  You get worse. Get help right away if:  Your chest hurts.  You have trouble breathing in enough air.  You have an uncomfortable feeling in your back, arms, or stomach.  You have trouble talking.  One side of your body feels weak.  A part of your face is hanging down. These symptoms may be an emergency. Do not wait to see if the symptoms will go away. Get medical help right away. Call your local emergency services (911 in the [place] Do not drive yourself to the hospital. Summary  This condition affects breathing during sleep.  The most common cause is a collapsed or blocked airway.  The goal of treatment is to help you breathe normally while you sleep. This information is not intended to replace advice given to you by your health care provider. Make sure you discuss any questions you have with your health care provider. Document Released: 07/03/2008 Document Revised: 07/11/2018 Document Reviewed: 05/20/2018 Elsevier Patient Education  [place].  CPAP and BPAP Information CPAP and BPAP are methods of helping a person breathe with the use of air pressure. CPAP stands for "continuous positive airway pressure." BPAP stands for "bi-level positive airway pressure." In both methods, air is blown through your nose or mouth and into your air passages to help you breathe well. CPAP and BPAP use different amounts of pressure to blow air. With CPAP, the amount of pressure stays the same while you breathe in and out. With BPAP, the amount of pressure is increased when you breathe in (inhale) so that you can take larger breaths. Your health care provider will recommend whether CPAP or BPAP would be more helpful for you. Why are CPAP and BPAP treatments used? CPAP or BPAP can be helpful if you have:  Sleep apnea.  Chronic obstructive pulmonary disease (COPD).  Heart failure.  Medical conditions that weaken the muscles of the chest including muscular dystrophy, or  neurological diseases such as amyotrophic lateral sclerosis (ALS).  Other problems that cause breathing to be weak, abnormal, or difficult. CPAP is most commonly used for obstructive sleep apnea (OSA) to keep the airways from collapsing when the muscles relax during sleep. How is CPAP or BPAP administered? Both CPAP and BPAP are provided by a small machine with a flexible plastic tube that attaches to a plastic mask. You wear the mask. Air is blown through the mask into your nose or mouth. The amount of pressure that is used to blow the air can be adjusted on the machine. Your health care provider will determine the pressure setting that should be used based on your individual needs. When should CPAP or BPAP be used? In most cases, the mask only needs to be worn during sleep. Generally, the mask needs to be worn throughout the night and during any daytime naps. People with certain medical conditions may also need to wear the  mask at other times when they are awake. Follow instructions from your health care provider about when to use the machine. What are some tips for using the mask?   Because the mask needs to be snug, some people feel trapped or closed-in (claustrophobic) when first using the mask. If you feel this way, you may need to get used to the mask. One way to do this is by holding the mask loosely over your nose or mouth and then gradually applying the mask more snugly. You can also gradually increase the amount of time that you use the mask.  Masks are available in various types and sizes. Some fit over your mouth and nose while others fit over just your nose. If your mask does not fit well, talk with your health care provider about getting a different one.  If you are using a mask that fits over your nose and you tend to breathe through your mouth, a chin strap may be applied to help keep your mouth closed.  The CPAP and BPAP machines have alarms that may sound if the mask comes off or  develops a leak.  If you have trouble with the mask, it is very important that you talk with your health care provider about finding a way to make the mask easier to tolerate. Do not stop using the mask. Stopping the use of the mask could have a negative impact on your health. What are some tips for using the machine?  Place your CPAP or BPAP machine on a secure table or stand near an electrical outlet.  Know where the on/off switch is located on the machine.  Follow instructions from your health care provider about how to set the pressure on your machine and when you should use it.  Do not eat or drink while the CPAP or BPAP machine is on. Food or fluids could get pushed into your lungs by the pressure of the CPAP or BPAP.  Do not smoke. Tobacco smoke residue can damage the machine.  For home use, CPAP and BPAP machines can be rented or purchased through home health care companies. Many different brands of machines are available. Renting a machine before purchasing may help you find out which particular machine works well for you.  Keep the CPAP or BPAP machine and attachments clean. Ask your health care provider for specific instructions. Get help right away if:  You have redness or open areas around your nose or mouth where the mask fits.  You have trouble using the CPAP or BPAP machine.  You cannot tolerate wearing the CPAP or BPAP mask.  You have pain, discomfort, and bloating in your abdomen. Summary  CPAP and BPAP are methods of helping a person breathe with the use of air pressure.  Both CPAP and BPAP are provided by a small machine with a flexible plastic tube that attaches to a plastic mask.  If you have trouble with the mask, it is very important that you talk with your health care provider about finding a way to make the mask easier to tolerate. This information is not intended to replace advice given to you by your health care provider. Make sure you discuss any  questions you have with your health care provider. Document Released: 06/22/2004 Document Revised: 01/14/2019 Document Reviewed: 08/13/2016 Elsevier Patient Education  2020 [employer].

## 2019-05-19 NOTE — Assessment & Plan Note (Signed)
Healthy weight loss discussed 

## 2019-05-19 NOTE — Progress Notes (Signed)
@Patient  ID: Dylan Webster, male    DOB: 04-06-1976, 43 y.o.   MRN: [id number]  Chief Complaint  Patient presents with  . Follow-up    OSA    Referring provider: Jearld Fenton, NP  HPI: 43 year old male seen for sleep consult Feb 12, 2019 found to have very severe sleep apnea  TEST/EVENTS :  N PSG 04/01/2019- very severe sleep apnea, AHI 93/hour, no significant central sleep apnea, O2 desaturation 75%, no significant PLM  05/19/2019 follow-up ; obstructive sleep apnea Patient presents for a follow-up for sleep apnea.  Patient was recently seen for a sleep consult May 2020.  He had daytime sleepiness restless sleep and snoring.  Patient underwent a in lab sleep study April 01, 2019 that found significant severe sleep apnea with an AHI at 93/hour, no significant central sleep apnea, O2 desaturation at 75%. We discussed his sleep study results.  Patient education on obstructive sleep apnea and potential complications of untreated sleep apnea including cardiovascular complications.  We discussed treatment options including weight loss and CPAP.  Patient education on CPAP usage.  Patient is acceptable to begin CPAP therapy  Patient says symptoms have been predominantly present over the last 3 years.  He is also had weight gain of greater than 50 pounds in the last 10 years.  We discussed weight loss options.  No Known Allergies   There is no immunization history on file for this patient.  Past Medical History:  Diagnosis Date  . Allergy     Tobacco History: Social History   Tobacco Use  Smoking Status Former Smoker  . Packs/day: 0.50  . Types: Cigarettes  . Quit date: 12/22/2018  . Years since quitting: 0.4  Smokeless Tobacco Never Used  Tobacco Comment   quit 12/2018   Counseling given: Not Answered Comment: quit 12/2018   Outpatient Medications Prior to Visit  Medication Sig Dispense Refill  . acetaminophen (TYLENOL) 325 MG tablet Take 650 mg by mouth as needed.    .  caffeine 200 MG TABS tablet Take 200 mg by mouth daily.    Marland Kitchen ibuprofen (ADVIL,MOTRIN) 200 MG tablet Take 800 mg by mouth daily as needed for moderate pain.      No facility-administered medications prior to visit.      Review of Systems:   Constitutional:   No  weight loss, night sweats,  Fevers, chills,  + fatigue, or  lassitude.  HEENT:   No headaches,  Difficulty swallowing,  Tooth/dental problems, or  Sore throat,                No sneezing, itching, ear ache, nasal congestion, post nasal drip,   CV:  No chest pain,  Orthopnea, PND, swelling in lower extremities, anasarca, dizziness, palpitations, syncope.   GI  No heartburn, indigestion, abdominal pain, nausea, vomiting, diarrhea, change in bowel habits, loss of appetite, bloody stools.   Resp: No shortness of breath with exertion or at rest.  No excess mucus, no productive cough,  No non-productive cough,  No coughing up of blood.  No change in color of mucus.  No wheezing.  No chest wall deformity  Skin: no rash or lesions.  GU: no dysuria, change in color of urine, no urgency or frequency.  No flank pain, no hematuria   MS:  No joint pain or swelling.  No decreased range of motion.  No back pain.    Physical Exam  BP 134/84 (BP Location: Left Arm, Cuff Size: Large)  Pulse 82   Temp 98.1 F (36.7 C) (Oral)   Ht 5\' 8"  (1.727 m)   Wt 244 lb 12.8 oz (111 kg)   SpO2 96%   BMI 37.22 kg/m   GEN: A/Ox3; pleasant , NAD, obese   HEENT:  [place]/AT,, NOSE-clear, THROAT-clear, no lesions, no postnasal drip or exudate noted.  Class II-III MP airway  NECK:  Supple w/ fair ROM; no JVD; normal carotid impulses w/o bruits; no thyromegaly or nodules palpated; no lymphadenopathy.    RESP  Clear  P & A; w/o, wheezes/ rales/ or rhonchi. no accessory muscle use, no dullness to percussion  CARD:  RRR, no m/r/g, no peripheral edema, pulses intact, no cyanosis or clubbing.  GI:   Soft & nt; nml bowel sounds; no organomegaly or masses  detected.   Musco: Warm bil, no deformities or joint swelling noted.   Neuro: alert, no focal deficits noted.    Skin: Warm, no lesions or rashes    Lab Results:  CBC  BNP No results found for: BNP  ProBNP No results found for: PROBNP  Imaging: No results found.    No flowsheet data found.  No results found for: NITRICOXIDE      Assessment & Plan:   OSA (obstructive sleep apnea) Severe sleep apnea-patient education given.  Treatment options including CPAP and weight loss discussed.  Patient like to proceed with CPAP therapy  Plan  Begin nocturnal CPAP with auto titration 5 to 15 cm H2O. Goal was to wear CPAP greater than 4 to 6 hours. Healthy weight. Caution advised around driving when drowsy Follow-up 6 to 8 weeks with CPAP download   Morbid (severe) obesity due to excess calories ([place]) Healthy weight loss discussed     Rexene Edison, NP 05/19/2019

## 2019-07-24 ENCOUNTER — Other Ambulatory Visit: Payer: Self-pay

## 2019-07-24 ENCOUNTER — Encounter: Payer: Self-pay | Admitting: Adult Health

## 2019-07-24 ENCOUNTER — Ambulatory Visit: Payer: BC Managed Care – PPO | Admitting: Adult Health

## 2019-07-24 DIAGNOSIS — G4733 Obstructive sleep apnea (adult) (pediatric): Secondary | ICD-10-CM | POA: Diagnosis not present

## 2019-07-24 NOTE — Assessment & Plan Note (Signed)
Very severe OSA - excellent control and compliance on CPAP  Will adjust pressure for comfort.  Trial of dream wear full face -sample given   Plan  Continue on CPAP at bedtime keep up the good work Will adjust CPAP pressure for comfort (10 to 18cmH2O)  Goal was to wear CPAP for at least 4 to 6 hours each night,  the longer you wear this the better Work on healthy weight Do not drive if  sleepy Follow-up in 6 months with Dr. Annamaria Boots or Dalis Beers NP and As needed

## 2019-07-24 NOTE — Patient Instructions (Addendum)
Continue on CPAP at bedtime keep up the good work Will adjust CPAP pressure for comfort (10 to 18cmH2O)  Goal was to wear CPAP for at least 4 to 6 hours each night,  the longer you wear this the better Work on healthy weight Do not drive if  sleepy Follow-up in 6 months with Dr. Annamaria Boots or Maezie Justin NP and As needed    Sleep Apnea Sleep apnea affects breathing during sleep. It causes breathing to stop for a short time or to become shallow. It can also increase the risk of:  Heart attack.  Stroke.  Being very overweight (obese).  Diabetes.  Heart failure.  Irregular heartbeat. The goal of treatment is to help you breathe normally again. What are the causes? There are three kinds of sleep apnea:  Obstructive sleep apnea. This is caused by a blocked or collapsed airway.  Central sleep apnea. This happens when the brain does not send the right signals to the muscles that control breathing.  Mixed sleep apnea. This is a combination of obstructive and central sleep apnea. The most common cause of this condition is a collapsed or blocked airway. This can happen if:  Your throat muscles are too relaxed.  Your tongue and tonsils are too large.  You are overweight.  Your airway is too small. What increases the risk?  Being overweight.  Smoking.  Having a small airway.  Being older.  Being male.  Drinking alcohol.  Taking medicines to calm yourself (sedatives or tranquilizers).  Having family members with the condition. What are the signs or symptoms?  Trouble staying asleep.  Being sleepy or tired during the day.  Getting angry a lot.  Loud snoring.  Headaches in the morning.  Not being able to focus your mind (concentrate).  Forgetting things.  Less interest in sex.  Mood swings.  Personality changes.  Feelings of sadness (depression).  Waking up a lot during the night to pee (urinate).  Dry mouth.  Sore throat. How is this diagnosed?  Your  medical history.  A physical exam.  A test that is done when you are sleeping (sleep study). The test is most often done in a sleep lab but may also be done at home. How is this treated?   Sleeping on your side.  Using a medicine to get rid of mucus in your nose (decongestant).  Avoiding the use of alcohol, medicines to help you relax, or certain pain medicines (narcotics).  Losing weight, if needed.  Changing your diet.  Not smoking.  Using a machine to open your airway while you sleep, such as: ? An oral appliance. This is a mouthpiece that shifts your lower jaw forward. ? A CPAP device. This device blows air through a mask when you breathe out (exhale). ? An EPAP device. This has valves that you put in each nostril. ? A BPAP device. This device blows air through a mask when you breathe in (inhale) and breathe out.  Having surgery if other treatments do not work. It is important to get treatment for sleep apnea. Without treatment, it can lead to:  High blood pressure.  Coronary artery disease.  In men, not being able to have an erection (impotence).  Reduced thinking ability. Follow these instructions at home: Lifestyle  Make changes that your doctor recommends.  Eat a healthy diet.  Lose weight if needed.  Avoid alcohol, medicines to help you relax, and some pain medicines.  Do not use any products that contain nicotine  or tobacco, such as cigarettes, e-cigarettes, and chewing tobacco. If you need help quitting, ask your doctor. General instructions  Take over-the-counter and prescription medicines only as told by your doctor.  If you were given a machine to use while you sleep, use it only as told by your doctor.  If you are having surgery, make sure to tell your doctor you have sleep apnea. You may need to bring your device with you.  Keep all follow-up visits as told by your doctor. This is important. Contact a doctor if:  The machine that you were  given to use during sleep bothers you or does not seem to be working.  You do not get better.  You get worse. Get help right away if:  Your chest hurts.  You have trouble breathing in enough air.  You have an uncomfortable feeling in your back, arms, or stomach.  You have trouble talking.  One side of your body feels weak.  A part of your face is hanging down. These symptoms may be an emergency. Do not wait to see if the symptoms will go away. Get medical help right away. Call your local emergency services (911 in the [place] Do not drive yourself to the hospital. Summary  This condition affects breathing during sleep.  The most common cause is a collapsed or blocked airway.  The goal of treatment is to help you breathe normally while you sleep. This information is not intended to replace advice given to you by your health care provider. Make sure you discuss any questions you have with your health care provider. Document Released: 07/03/2008 Document Revised: 07/11/2018 Document Reviewed: 05/20/2018 Elsevier Patient Education  [place].  CPAP and BPAP Information CPAP and BPAP are methods of helping a person breathe with the use of air pressure. CPAP stands for "continuous positive airway pressure." BPAP stands for "bi-level positive airway pressure." In both methods, air is blown through your nose or mouth and into your air passages to help you breathe well. CPAP and BPAP use different amounts of pressure to blow air. With CPAP, the amount of pressure stays the same while you breathe in and out. With BPAP, the amount of pressure is increased when you breathe in (inhale) so that you can take larger breaths. Your health care provider will recommend whether CPAP or BPAP would be more helpful for you. Why are CPAP and BPAP treatments used? CPAP or BPAP can be helpful if you have:  Sleep apnea.  Chronic obstructive pulmonary disease (COPD).  Heart failure.  Medical  conditions that weaken the muscles of the chest including muscular dystrophy, or neurological diseases such as amyotrophic lateral sclerosis (ALS).  Other problems that cause breathing to be weak, abnormal, or difficult. CPAP is most commonly used for obstructive sleep apnea (OSA) to keep the airways from collapsing when the muscles relax during sleep. How is CPAP or BPAP administered? Both CPAP and BPAP are provided by a small machine with a flexible plastic tube that attaches to a plastic mask. You wear the mask. Air is blown through the mask into your nose or mouth. The amount of pressure that is used to blow the air can be adjusted on the machine. Your health care provider will determine the pressure setting that should be used based on your individual needs. When should CPAP or BPAP be used? In most cases, the mask only needs to be worn during sleep. Generally, the mask needs to be worn throughout the night  and during any daytime naps. People with certain medical conditions may also need to wear the mask at other times when they are awake. Follow instructions from your health care provider about when to use the machine. What are some tips for using the mask?   Because the mask needs to be snug, some people feel trapped or closed-in (claustrophobic) when first using the mask. If you feel this way, you may need to get used to the mask. One way to do this is by holding the mask loosely over your nose or mouth and then gradually applying the mask more snugly. You can also gradually increase the amount of time that you use the mask.  Masks are available in various types and sizes. Some fit over your mouth and nose while others fit over just your nose. If your mask does not fit well, talk with your health care provider about getting a different one.  If you are using a mask that fits over your nose and you tend to breathe through your mouth, a chin strap may be applied to help keep your mouth closed.   The CPAP and BPAP machines have alarms that may sound if the mask comes off or develops a leak.  If you have trouble with the mask, it is very important that you talk with your health care provider about finding a way to make the mask easier to tolerate. Do not stop using the mask. Stopping the use of the mask could have a negative impact on your health. What are some tips for using the machine?  Place your CPAP or BPAP machine on a secure table or stand near an electrical outlet.  Know where the on/off switch is located on the machine.  Follow instructions from your health care provider about how to set the pressure on your machine and when you should use it.  Do not eat or drink while the CPAP or BPAP machine is on. Food or fluids could get pushed into your lungs by the pressure of the CPAP or BPAP.  Do not smoke. Tobacco smoke residue can damage the machine.  For home use, CPAP and BPAP machines can be rented or purchased through home health care companies. Many different brands of machines are available. Renting a machine before purchasing may help you find out which particular machine works well for you.  Keep the CPAP or BPAP machine and attachments clean. Ask your health care provider for specific instructions. Get help right away if:  You have redness or open areas around your nose or mouth where the mask fits.  You have trouble using the CPAP or BPAP machine.  You cannot tolerate wearing the CPAP or BPAP mask.  You have pain, discomfort, and bloating in your abdomen. Summary  CPAP and BPAP are methods of helping a person breathe with the use of air pressure.  Both CPAP and BPAP are provided by a small machine with a flexible plastic tube that attaches to a plastic mask.  If you have trouble with the mask, it is very important that you talk with your health care provider about finding a way to make the mask easier to tolerate. This information is not intended to replace  advice given to you by your health care provider. Make sure you discuss any questions you have with your health care provider. Document Released: 06/22/2004 Document Revised: 01/14/2019 Document Reviewed: 08/13/2016 Elsevier Patient Education  2020 [employer].

## 2019-07-24 NOTE — Progress Notes (Signed)
@Patient  ID: Dylan Webster, male    DOB: 03-02-1976, 43 y.o.   MRN: [id number]  Chief Complaint  Patient presents with  . Follow-up    OSA    Referring provider: Jearld Fenton, NP  HPI: 43 year old male seen for sleep consult Feb 12, 2019 found to have very severe sleep apnea   TEST/EVENTS :  N PSG 04/01/2019- very severe sleep apnea, AHI 93/hour, no significant central sleep apnea, O2 desaturation 75%, no significant PLM  07/24/2019 Follow up: OSA  Patient presents for a 2-month follow-up.  Patient has known very severe sleep apnea.  He has recently been started on CPAP.  He says he is doing well on CPAP.  He wears it every single night.  He feels rested with increased daytime sleepiness.  Download shows excellent compliance with daily average usage around 5 hours.  AHI 0.8.  Patient says he feels that the pressure is sometimes not strong enough.  He also would like to look at additional fullface masks as his mask sometimes does not feel like it is tight enough.. Definitely feels he is improved with decreased sleepiness and increased energy.  Active at work. Trying to walk more. We discussed healthy weight loss.    No Known Allergies   There is no immunization history on file for this patient.  Past Medical History:  Diagnosis Date  . Allergy     Tobacco History: Social History   Tobacco Use  Smoking Status Former Smoker  . Packs/day: 0.50  . Types: Cigarettes  . Quit date: 12/22/2018  . Years since quitting: 0.5  Smokeless Tobacco Never Used  Tobacco Comment   quit 12/2018   Counseling given: Not Answered Comment: quit 12/2018   Outpatient Medications Prior to Visit  Medication Sig Dispense Refill  . acetaminophen (TYLENOL) 325 MG tablet Take 650 mg by mouth as needed.    Marland Kitchen ibuprofen (ADVIL,MOTRIN) 200 MG tablet Take 800 mg by mouth daily as needed for moderate pain.     . caffeine 200 MG TABS tablet Take 200 mg by mouth daily.     No facility-administered  medications prior to visit.      Review of Systems:   Constitutional:   No  weight loss, night sweats,  Fevers, chills, fatigue, or  lassitude.  HEENT:   No headaches,  Difficulty swallowing,  Tooth/dental problems, or  Sore throat,                No sneezing, itching, ear ache, nasal congestion, post nasal drip,   CV:  No chest pain,  Orthopnea, PND, swelling in lower extremities, anasarca, dizziness, palpitations, syncope.   GI  No heartburn, indigestion, abdominal pain, nausea, vomiting, diarrhea, change in bowel habits, loss of appetite, bloody stools.   Resp: No shortness of breath with exertion or at rest.  No excess mucus, no productive cough,  No non-productive cough,  No coughing up of blood.  No change in color of mucus.  No wheezing.  No chest wall deformity  Skin: no rash or lesions.  GU: no dysuria, change in color of urine, no urgency or frequency.  No flank pain, no hematuria   MS:    No decreased range of motion.  No back pain. Chronic knee pain     Physical Exam  BP 120/82 (BP Location: Left Arm, Cuff Size: Large)   Pulse 77   Temp 98.1 F (36.7 C) (Temporal)   Ht 5\' 8"  (1.727 m)   Wt  244 lb 9.6 oz (110.9 kg)   SpO2 96%   BMI 37.19 kg/m   GEN: A/Ox3; pleasant , NAD, Obese per BMI    HEENT:  [place]/AT,  EACs-clear, TMs-wnl, NOSE-clear, THROAT-clear, no lesions, no postnasal drip or exudate noted. Class 3 MP airway   NECK:  Supple w/ fair ROM; no JVD; normal carotid impulses w/o bruits; no thyromegaly or nodules palpated; no lymphadenopathy.    RESP  Clear  P & A; w/o, wheezes/ rales/ or rhonchi. no accessory muscle use, no dullness to percussion  CARD:  RRR, no m/r/g, no peripheral edema, pulses intact, no cyanosis or clubbing.  GI:   Soft & nt; nml bowel sounds; no organomegaly or masses detected.   Musco: Warm bil, no deformities or joint swelling noted.   Neuro: alert, no focal deficits noted.    Skin: Warm, no lesions or rashes    Lab Results:   CBC  BMET   BNP No results found for: BNP  ProBNP No results found for: PROBNP  Imaging: No results found.    No flowsheet data found.  No results found for: NITRICOXIDE      Assessment & Plan:   OSA (obstructive sleep apnea) Very severe OSA - excellent control and compliance on CPAP  Will adjust pressure for comfort.  Trial of dream wear full face -sample given   Plan  Continue on CPAP at bedtime keep up the good work Will adjust CPAP pressure for comfort (10 to 18cmH2O)  Goal was to wear CPAP for at least 4 to 6 hours each night,  the longer you wear this the better Work on healthy weight Do not drive if  sleepy Follow-up in 6 months with Dr. Annamaria Boots or  NP and As needed       Morbid (severe) obesity due to excess calories ([place]) Healthy weight loss.      Rexene Edison, NP 07/24/2019

## 2019-07-24 NOTE — Addendum Note (Signed)
Addended by: Parke Poisson E on: 07/24/2019 02:55 PM   Modules accepted: Orders

## 2019-07-24 NOTE — Assessment & Plan Note (Signed)
Healthy weight loss 

## 2019-07-30 ENCOUNTER — Emergency Department (HOSPITAL_COMMUNITY)
Admission: EM | Admit: 2019-07-30 | Discharge: 2019-07-30 | Disposition: A | Discharge status: Home / Self Care | Payer: BC Managed Care – PPO | Attending: Emergency Medicine | Admitting: Emergency Medicine

## 2019-07-30 ENCOUNTER — Other Ambulatory Visit: Payer: Self-pay

## 2019-07-30 ENCOUNTER — Emergency Department (HOSPITAL_COMMUNITY): Payer: BC Managed Care – PPO

## 2019-07-30 DIAGNOSIS — I10 Essential (primary) hypertension: Secondary | ICD-10-CM | POA: Diagnosis not present

## 2019-07-30 DIAGNOSIS — R079 Chest pain, unspecified: Secondary | ICD-10-CM

## 2019-07-30 DIAGNOSIS — R0789 Other chest pain: Secondary | ICD-10-CM | POA: Diagnosis not present

## 2019-07-30 DIAGNOSIS — Z87891 Personal history of nicotine dependence: Secondary | ICD-10-CM | POA: Diagnosis not present

## 2019-07-30 LAB — BASIC METABOLIC PANEL
Anion gap: 10 (ref 5–15)
BUN: 15 mg/dL (ref 6–20)
CO2: 24 mmol/L (ref 22–32)
Calcium: 9.4 mg/dL (ref 8.9–10.3)
Chloride: 106 mmol/L (ref 98–111)
Creatinine, Ser: 0.95 mg/dL (ref 0.61–1.24)
GFR calc Af Amer: >60 mL/min (ref >60)
GFR calc non Af Amer: >60 mL/min (ref >60)
Glucose, Bld: 111 mg/dL — ABNORMAL HIGH (ref 70–99)
Potassium: 3.4 mmol/L — ABNORMAL LOW (ref 3.5–5.1)
Sodium: 140 mmol/L (ref 135–145)

## 2019-07-30 LAB — CBC
HCT: 35.7 % — ABNORMAL LOW (ref 39.0–52.0)
Hemoglobin: 12.5 g/dL — ABNORMAL LOW (ref 13.0–17.0)
MCH: 32.1 pg (ref 26.0–34.0)
MCHC: 35 g/dL (ref 30.0–36.0)
MCV: 91.5 fL (ref 80.0–100.0)
Platelets: 260 10*3/uL (ref 150–400)
RBC: 3.9 MIL/uL — ABNORMAL LOW (ref 4.22–5.81)
RDW: 11.6 % (ref 11.5–15.5)
WBC: 8.1 10*3/uL (ref 4.0–10.5)
nRBC: 0 % (ref 0.0–0.2)

## 2019-07-30 LAB — TROPONIN I (HIGH SENSITIVITY)
Troponin I (High Sensitivity): 4 ng/L (ref <18)
Troponin I (High Sensitivity): 4 ng/L (ref <18)

## 2019-07-30 MED ORDER — SODIUM CHLORIDE 0.9% FLUSH
3.0000 mL | Freq: Once | INTRAVENOUS | Status: AC
  Administered 2019-07-30: 3 mL via INTRAVENOUS

## 2019-07-30 MED ORDER — KETOROLAC TROMETHAMINE 30 MG/ML IJ SOLN
15.0000 mg | Freq: Once | INTRAMUSCULAR | Status: AC
  Administered 2019-07-30: 04:00:00 15 mg via INTRAVENOUS
  Filled 2019-07-30: qty 1

## 2019-07-30 MED ORDER — POTASSIUM CHLORIDE CRYS ER 20 MEQ PO TBCR
40.0000 meq | EXTENDED_RELEASE_TABLET | Freq: Once | ORAL | Status: AC
  Administered 2019-07-30: 04:00:00 40 meq via ORAL
  Filled 2019-07-30: qty 2

## 2019-07-30 MED ORDER — MAGNESIUM SULFATE 2 GM/50ML IV SOLN
2.0000 g | Freq: Once | INTRAVENOUS | Status: AC
  Administered 2019-07-30: 04:00:00 2 g via INTRAVENOUS
  Filled 2019-07-30: qty 50

## 2019-07-30 NOTE — ED Triage Notes (Signed)
Received asymptomatic, patient received aspirin and nitro en route to the hospital and had relief with it.

## 2019-07-30 NOTE — ED Provider Notes (Signed)
Emergency Department Provider Note   I have reviewed the triage vital signs and the nursing notes.   HISTORY  Chief Complaint Chest Pain   HPI Dylan Webster is a 43 y.o. male who presents to the emergency department today with chest pain.  Patient states he has had nonspecific chest pain multiple times in the past.  Sometimes it central sometimes is left-sided.  This time it woke him up from sleep with a sharp pain right underneath his right breast.  States he had a little bit of sweatiness with it and nausea but no dyspnea or lightheadedness.  Patient states he took some ibuprofen and started get married and EMS arrived and gave aspirin and nitro and it continued to improve.  He got worse again when he was transferring to the x-ray table.  Patient without any symptoms of DVT.  No recent fever cough or other illnesses.  No known past medical history besides arthritis.  He is overweight.   No other associated or modifying symptoms.    Past Medical History:  Diagnosis Date  . Allergy     Patient Active Problem List   Diagnosis Date Noted  . OSA (obstructive sleep apnea) 04/01/2019  . Snoring 02/24/2019  . Morbid (severe) obesity due to excess calories ([place]) 02/24/2019  . Hypertension, essential 02/24/2019    Past Surgical History:  Procedure Laterality Date  . NO PAST SURGERIES      Current Outpatient Rx  . Order #: [id number] Class: Historical Med  . Order #: [id number] Class: Historical Med    Allergies Patient has no known allergies.  Family History  Problem Relation Age of Onset  . Alcohol abuse Father   . Heart disease Father   . Alcohol abuse Mother   . Diabetes Mother   . Hyperlipidemia Maternal Aunt   . Heart disease Paternal Uncle   . Hypertension Paternal Uncle   . Alcohol abuse Paternal Grandfather   . Hyperlipidemia Paternal Grandfather   . Heart disease Paternal Grandfather   . Hypertension Paternal Grandfather   . Diabetes Paternal Grandfather   .  Kidney disease Paternal Grandfather   . Leukemia Maternal Aunt     Social History Social History   Tobacco Use  . Smoking status: Former Smoker    Packs/day: 0.50    Types: Cigarettes    Quit date: 12/22/2018    Years since quitting: 0.6  . Smokeless tobacco: Never Used  . Tobacco comment: quit 12/2018  Substance Use Topics  . Alcohol use: No  . Drug use: No    Review of Systems  All other systems negative except as documented in the HPI. All pertinent positives and negatives as reviewed in the HPI. ____________________________________________   PHYSICAL EXAM:  VITAL SIGNS: ED Triage Vitals  Enc Vitals Group     BP 07/30/19 0158 (!) 147/95     Pulse Rate 07/30/19 0200 78     Resp 07/30/19 0158 (!) 21     Temp --      Temp src --      SpO2 07/30/19 0152 98 %     Weight 07/30/19 0153 244 lb (110.7 kg)     Height 07/30/19 0153 5\' 9"  (1.753 m)    Constitutional: Alert and oriented. Well appearing and in no acute distress. Eyes: Conjunctivae are normal. PERRL. EOMI. Head: Atraumatic. Nose: No congestion/rhinnorhea. Mouth/Throat: Mucous membranes are moist.  Oropharynx non-erythematous. Neck: No stridor.  No meningeal signs.   Cardiovascular: Normal rate, regular rhythm. Good  peripheral circulation. Grossly normal heart sounds.   Respiratory: Normal respiratory effort.  No retractions. Lungs CTAB. Gastrointestinal: Soft and nontender. No distention.  Musculoskeletal: No lower extremity tenderness nor edema. No gross deformities of extremities. Neurologic:  Normal speech and language. No gross focal neurologic deficits are appreciated.  Skin:  Skin is warm, dry and intact. No rash noted.   ____________________________________________   LABS (all labs ordered are listed, but only abnormal results are displayed)  Labs Reviewed  BASIC METABOLIC PANEL - Abnormal; Notable for the following components:      Result Value   Potassium 3.4 (*)    Glucose, Bld 111 (*)     All other components within normal limits  CBC - Abnormal; Notable for the following components:   RBC 3.90 (*)    Hemoglobin 12.5 (*)    HCT 35.7 (*)    All other components within normal limits  TROPONIN I (HIGH SENSITIVITY)  TROPONIN I (HIGH SENSITIVITY)   ____________________________________________  EKG   EKG Interpretation  Date/Time:  Thursday July 30 2019 02:01:53 EDT Ventricular Rate:  78 PR Interval:    QRS Duration: 81 QT Interval:  372 QTC Calculation: 424 R Axis:   25 Text Interpretation:  Sinus rhythm Borderline repolarization abnormality TWI in  III appears different than pevious ecg's nonspecific ST changes in multiple leads different from july 2019, may be positional.  will repeat. Confirmed by Merrily Pew [phone]) on 07/30/2019 3:10:28 AM       EKG Interpretation  Date/Time:  Thursday July 30 2019 03:24:47 EDT Ventricular Rate:  67 PR Interval:    QRS Duration: 84 QT Interval:  403 QTC Calculation: 426 R Axis:   34 Text Interpretation:  Sinus rhythm Nonspecific repol abnormality, inferior leads Borderline ST elevation, lateral leads No significant change since last tracing Confirmed by Merrily Pew [phone]) on 07/30/2019 7:57:49 AM      ____________________________________________  RADIOLOGY  Dg Chest 2 View  Result Date: 07/30/2019 CLINICAL DATA:  Chest pain. Shortness of breath. EXAM: CHEST - 2 VIEW COMPARISON:  04/29/2018 FINDINGS: The cardiomediastinal contours are normal. Streaky retrocardiac opacity is unchanged from prior exam and likely scarring. Pulmonary vasculature is normal. No consolidation, pleural effusion, or pneumothorax. No acute osseous abnormalities are seen. IMPRESSION: Unchanged streaky retrocardiac opacity, likely scarring. No acute abnormality. Electronically Signed   By: Keith Rake M.D.   On: 07/30/2019 02:57   ____________________________________________   PROCEDURES  Procedure(s) performed:   Procedures    ____________________________________________   INITIAL IMPRESSION / ASSESSMENT AND PLAN / ED COURSE  Seems MSK in nature. HEART score of 2. Will treat symptoms, delta troponins, likely outpatient follow up.  Low suspicion for PE without risk factors, dyspnea, hypoxia or other objective findings.  Doubt dissection, PTX, pneumonia, etc.   Repeat ecg's stable. troponins stable. Will dc to fu w/ pcp and cardiology.   Pertinent labs & imaging results that were available during my care of the patient were reviewed by me and considered in my medical decision making (see chart for details).  A medical screening exam was performed and I feel the patient has had an appropriate workup for their chief complaint at this time and likelihood of emergent condition existing is low. They have been counseled on decision, discharge, follow up and which symptoms necessitate immediate return to the emergency department. They or their family verbally stated understanding and agreement with plan and discharged in stable condition.   ____________________________________________  FINAL CLINICAL IMPRESSION(S) / ED DIAGNOSES  Final diagnoses:  Nonspecific chest pain     MEDICATIONS GIVEN DURING THIS VISIT:  Medications  sodium chloride flush (NS) 0.9 % injection 3 mL (3 mLs Intravenous Given 07/30/19 0417)  ketorolac (TORADOL) 30 MG/ML injection 15 mg (15 mg Intravenous Given 07/30/19 0331)  potassium chloride SA (KLOR-CON) CR tablet 40 mEq (40 mEq Oral Given 07/30/19 0409)  magnesium sulfate IVPB 2 g 50 mL (0 g Intravenous Stopped 07/30/19 0530)     NEW OUTPATIENT MEDICATIONS STARTED DURING THIS VISIT:  Discharge Medication List as of 07/30/2019  6:26 AM      Note:  This note was prepared with assistance of Dragon voice recognition software. Occasional wrong-word or sound-a-like substitutions may have occurred due to the inherent limitations of voice recognition software.   Heman Que, Corene Cornea, MD  07/30/19 [phone]

## 2019-08-06 ENCOUNTER — Other Ambulatory Visit: Payer: Self-pay

## 2019-08-06 ENCOUNTER — Encounter: Payer: Self-pay | Admitting: Internal Medicine

## 2019-08-06 ENCOUNTER — Ambulatory Visit: Payer: BC Managed Care – PPO | Admitting: Internal Medicine

## 2019-08-06 VITALS — BP 142/92 | HR 92 | Temp 98.7°F | Wt 242.0 lb

## 2019-08-06 DIAGNOSIS — I10 Essential (primary) hypertension: Secondary | ICD-10-CM

## 2019-08-06 DIAGNOSIS — R0789 Other chest pain: Secondary | ICD-10-CM

## 2019-08-06 DIAGNOSIS — Z8249 Family history of ischemic heart disease and other diseases of the circulatory system: Secondary | ICD-10-CM | POA: Diagnosis not present

## 2019-08-06 NOTE — Patient Instructions (Signed)

## 2019-08-06 NOTE — Progress Notes (Signed)
Subjective:    Patient ID: Dylan Webster, male    DOB: 02-22-1976, 43 y.o.   MRN: [id number]  HPI  Pt presents to the clinic today for ER follow up. He presented to the ER 10/22 with c/o chest pain located underneath his right breast. He reports associated sweatiness and nausea but no dizziness, visual changes, chest tightness, shortness of breath or vomiting. He took Ibuprofen, called EMS. He was given Nitro and ASA with some improvement in symptoms. ECG with borderline ST elevation in inferior leads, but no acute findings. Lab workup including troponin's negative. Chest xray is negative. They felt like this was muscular in nature. He was treated with a Toradol injection, advised to follow up with PCP and cardiology. He mentions a family history of heart disease on his paternal side. Since discharge, he has felt discomfort (soreness) but no associated pain with it. It is not as significant as the previous episode. He denies any fevers, chills, nausea, or vomiting. He quit smoking in March 2020.  Review of Systems      Past Medical History:  Diagnosis Date  . Allergy     Current Outpatient Medications  Medication Sig Dispense Refill  . acetaminophen (TYLENOL) 325 MG tablet Take 650 mg by mouth as needed.    Marland Kitchen ibuprofen (ADVIL,MOTRIN) 200 MG tablet Take 800 mg by mouth daily as needed for moderate pain.      No current facility-administered medications for this visit.     No Known Allergies  Family History  Problem Relation Age of Onset  . Alcohol abuse Father   . Heart disease Father   . Alcohol abuse Mother   . Diabetes Mother   . Hyperlipidemia Maternal Aunt   . Heart disease Paternal Uncle   . Hypertension Paternal Uncle   . Alcohol abuse Paternal Grandfather   . Hyperlipidemia Paternal Grandfather   . Heart disease Paternal Grandfather   . Hypertension Paternal Grandfather   . Diabetes Paternal Grandfather   . Kidney disease Paternal Grandfather   . Leukemia Maternal  Aunt     Social History   Socioeconomic History  . Marital status: Single    Spouse name: Not on file  . Number of children: Not on file  . Years of education: Not on file  . Highest education level: Not on file  Occupational History  . Not on file  Social Needs  . Financial resource strain: Not on file  . Food insecurity    Worry: Not on file    Inability: Not on file  . Transportation needs    Medical: Not on file    Non-medical: Not on file  Tobacco Use  . Smoking status: Former Smoker    Packs/day: 0.50    Types: Cigarettes    Quit date: 12/22/2018    Years since quitting: 0.6  . Smokeless tobacco: Never Used  . Tobacco comment: quit 12/2018  Substance and Sexual Activity  . Alcohol use: No  . Drug use: No  . Sexual activity: Yes    Birth control/protection: None  Lifestyle  . Physical activity    Days per week: Not on file    Minutes per session: Not on file  . Stress: Not on file  Relationships  . Social Herbalist on phone: Not on file    Gets together: Not on file    Attends religious service: Not on file    Active member of club or organization: Not on  file    Attends meetings of clubs or organizations: Not on file    Relationship status: Not on file  . Intimate partner violence    Fear of current or ex partner: Not on file    Emotionally abused: Not on file    Physically abused: Not on file    Forced sexual activity: Not on file  Other Topics Concern  . Not on file  Social History Narrative   ** Merged History Encounter **       Married, 4 children  Job: [employer]  Will start exercising regularly     Constitutional: Denies fever, malaise, fatigue, headache or abrupt weight changes.  Respiratory: Denies difficulty breathing, shortness of breath, cough or sputum production.   Cardiovascular: Denies chest pain, chest tightness, palpitations or swelling in the hands or feet.  Musculoskeletal: Discomfort to right side of  chest. Denies decrease in range of motion, difficulty with gait, or joint pain and swelling.   No other specific complaints in a complete review of systems (except as listed in HPI above).  Objective:   Physical Exam   BP (!) 142/92   Pulse 92   Temp 98.7 F (37.1 C) (Temporal)   Wt 242 lb (109.8 kg)   SpO2 98%   BMI 35.74 kg/m  Wt Readings from Last 3 Encounters:  08/06/19 242 lb (109.8 kg)  07/30/19 244 lb (110.7 kg)  07/24/19 244 lb 9.6 oz (110.9 kg)    General: Appears his stated age, obese in NAD. Skin: Warm, dry and intact. No rashes noted..  Cardiovascular: Normal rate and rhythm. S1,S2 noted.  No murmur, rubs or gallops noted. No JVD or BLE edema. No carotid bruits noted. Pulmonary/Chest: Normal effort and positive vesicular breath sounds. No respiratory distress. No wheezes, rales or ronchi noted.  Musculoskeletal: No pain to palpation to anterior chest. Strength 5/5 BUE/BLE. No difficulty with gait.      BMET    Component Value Date/Time   NA 140 07/30/2019 0220   K 3.4 (L) 07/30/2019 0220   CL 106 07/30/2019 0220   CO2 24 07/30/2019 0220   GLUCOSE 111 (H) 07/30/2019 0220   BUN 15 07/30/2019 0220   CREATININE 0.95 07/30/2019 0220   CALCIUM 9.4 07/30/2019 0220   GFRNONAA >60 07/30/2019 0220   GFRAA >60 07/30/2019 0220    Lipid Panel     Component Value Date/Time   CHOL 183 02/05/2019 0809   TRIG 72.0 02/05/2019 0809   HDL 43.20 02/05/2019 0809   CHOLHDL 4 02/05/2019 0809   VLDL 14.4 02/05/2019 0809   LDLCALC 126 (H) 02/05/2019 0809    CBC    Component Value Date/Time   WBC 8.1 07/30/2019 0220   RBC 3.90 (L) 07/30/2019 0220   HGB 12.5 (L) 07/30/2019 0220   HCT 35.7 (L) 07/30/2019 0220   PLT 260 07/30/2019 0220   MCV 91.5 07/30/2019 0220   MCH 32.1 07/30/2019 0220   MCHC 35.0 07/30/2019 0220   RDW 11.6 07/30/2019 0220   LYMPHSABS 3.4 05/24/2011 0613   MONOABS 0.8 05/24/2011 0613   EOSABS 0.2 05/24/2011 0613   BASOSABS 0.0 05/24/2011 0613     Hgb A1C Lab Results  Component Value Date   HGBA1C 4.9 02/05/2019           Assessment & Plan:  ER Follow Up for Atypical Chest Pain:  ER notes, labs and imaging reviewed Will refer to cardiology per his request for follow-up  No other intervention  needed at this time  HTN:  Intermittently elevated, but he reports anxiety with coming to the doctor's offce Will monitor If elevated at cardiology office, would consider antihypertensive therapy.  Advised him to make an appt for his annual exam Webb Silversmith, NP

## 2019-08-16 NOTE — Progress Notes (Deleted)
Cardiology Office Note:   Date:  08/16/2019  NAME:  Dylan Webster    MRN: [id number] DOB:  1976/10/03   PCP:  Jearld Fenton, NP  Cardiologist:  No primary care provider on file.  Electrophysiologist:  None   Referring MD: Jearld Fenton, NP   No chief complaint on file. ***  History of Present Illness:   Dylan Webster is a 43 y.o. male with a hx of hypertension who is being seen today for the evaluation of chest pain at the request of Jearld Fenton, NP. He was recently seen in the ER on 10/22 for right chest pain. EKG without acute changes, and troponin negative x 2.   Past Medical History: Past Medical History:  Diagnosis Date  . Allergy     Past Surgical History: Past Surgical History:  Procedure Laterality Date  . NO PAST SURGERIES      Current Medications: No outpatient medications have been marked as taking for the 08/17/19 encounter (Appointment) with O'Neal, Cassie Freer, MD.     Allergies:    Patient has no known allergies.   Social History: Social History   Socioeconomic History  . Marital status: Single    Spouse name: Not on file  . Number of children: Not on file  . Years of education: Not on file  . Highest education level: Not on file  Occupational History  . Not on file  Social Needs  . Financial resource strain: Not on file  . Food insecurity    Worry: Not on file    Inability: Not on file  . Transportation needs    Medical: Not on file    Non-medical: Not on file  Tobacco Use  . Smoking status: Former Smoker    Packs/day: 0.50    Types: Cigarettes    Quit date: 12/22/2018    Years since quitting: 0.6  . Smokeless tobacco: Never Used  . Tobacco comment: quit 12/2018  Substance and Sexual Activity  . Alcohol use: No  . Drug use: No  . Sexual activity: Yes    Birth control/protection: None  Lifestyle  . Physical activity    Days per week: Not on file    Minutes per session: Not on file  . Stress: Not on file  Relationships  .  Social Herbalist on phone: Not on file    Gets together: Not on file    Attends religious service: Not on file    Active member of club or organization: Not on file    Attends meetings of clubs or organizations: Not on file    Relationship status: Not on file  Other Topics Concern  . 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  ROS:   All other ROS reviewed and negative. Pertinent positives noted in the HPI.     EKGs/Labs/Other Studies Reviewed:   The following studies were personally reviewed by me today:  EKG:  EKG  is *** ordered today.  The ekg ordered today demonstrates ***, and was personally reviewed by me.   Recent Labs: 02/05/2019: ALT 21 07/30/2019: BUN 15; Creatinine, Ser 0.95; Hemoglobin 12.5; Platelets 260; Potassium 3.4; Sodium 140   Recent Lipid Panel    Component Value Date/Time   CHOL 183 02/05/2019 0809   TRIG 72.0 02/05/2019 0809   HDL 43.20 02/05/2019 0809   CHOLHDL 4 02/05/2019 0809   VLDL 14.4 02/05/2019 0809   LDLCALC 126 (H) 02/05/2019 0809    Physical Exam:   VS:  There were no vitals taken for this visit.   Wt Readings from Last 3 Encounters:  08/06/19 242 lb (109.8 kg)  07/30/19 244 lb (110.7 kg)  07/24/19 244 lb 9.6 oz (110.9 kg)    General: Well nourished, well developed, in no acute distress Heart: Atraumatic, normal size  Eyes: PEERLA, EOMI  Neck: Supple, no JVD Endocrine:  No thryomegaly Cardiac: Normal S1, S2; RRR; no murmurs, rubs, or gallops Lungs: Clear to auscultation bilaterally, no wheezing, rhonchi or rales  Abd: Soft, nontender, no hepatomegaly  Ext: No edema, pulses 2+ Musculoskeletal: No deformities, BUE and BLE strength normal and equal Skin: Warm and dry, no rashes   Neuro: Alert and oriented to person, place, time, and situation, CNII-XII grossly intact, no focal deficits  Psych: Normal mood and affect   ASSESSMENT:   Dylan Webster is a 43 y.o. male who presents for the following: No diagnosis found.  PLAN:   There are no diagnoses linked to this encounter.  Disposition: No follow-ups on file.  Medication Adjustments/Labs and Tests Ordered: Current medicines are reviewed at length with the patient today.  Concerns regarding medicines are outlined above.  No orders of the defined types were placed in this encounter.  No orders of the defined types were placed in this encounter.   There are no Patient Instructions on file for this visit.   Signed, Addison Naegeli. Audie Box, [place]  [address], [place], [place] [address] [phone]  08/16/2019 6:49 PM

## 2019-08-17 ENCOUNTER — Other Ambulatory Visit: Payer: Self-pay

## 2019-08-17 ENCOUNTER — Ambulatory Visit: Payer: BC Managed Care – PPO | Admitting: Cardiovascular Disease

## 2019-08-19 ENCOUNTER — Other Ambulatory Visit: Payer: Self-pay

## 2019-08-19 DIAGNOSIS — Z20822 Contact with and (suspected) exposure to covid-19: Secondary | ICD-10-CM

## 2019-08-21 LAB — NOVEL CORONAVIRUS, NAA: SARS-CoV-2, NAA: NOT DETECTED

## 2019-09-14 NOTE — Progress Notes (Deleted)
Cardiology Office Note:   Date:  09/14/2019  NAME:  Dylan Webster    MRN: [id number] DOB:  31-Mar-1976   PCP:  Jearld Fenton, NP  Cardiologist:  No primary care provider on file.  Electrophysiologist:  None   Referring MD: Jearld Fenton, NP   No chief complaint on file. ***  History of Present Illness:   Dylan Webster is a 43 y.o. male with a hx of hypertension who is being seen today for the evaluation of chest pain at the request of Jearld Fenton, NP. ER visit 07/30/2019 with negative troponin and EKG without acute ST-T changes.   Past Medical History: Past Medical History:  Diagnosis Date  . Allergy     Past Surgical History: Past Surgical History:  Procedure Laterality Date  . NO PAST SURGERIES      Current Medications: No outpatient medications have been marked as taking for the 09/15/19 encounter (Appointment) with O'Neal, Cassie Freer, MD.     Allergies:    Patient has no known allergies.   Social History: Social History   Socioeconomic History  . Marital status: Single    Spouse name: Not on file  . Number of children: Not on file  . Years of education: Not on file  . Highest education level: Not on file  Occupational History  . Not on file  Social Needs  . Financial resource strain: Not on file  . Food insecurity    Worry: Not on file    Inability: Not on file  . Transportation needs    Medical: Not on file    Non-medical: Not on file  Tobacco Use  . Smoking status: Former Smoker    Packs/day: 0.50    Types: Cigarettes    Quit date: 12/22/2018    Years since quitting: 0.7  . Smokeless tobacco: Never Used  . Tobacco comment: quit 12/2018  Substance and Sexual Activity  . Alcohol use: No  . Drug use: No  . Sexual activity: Yes    Birth control/protection: None  Lifestyle  . Physical activity    Days per week: Not on file    Minutes per session: Not on file  . Stress: Not on file  Relationships  . Social Herbalist on phone:  Not on file    Gets together: Not on file    Attends religious service: Not on file    Active member of club or organization: Not on file    Attends meetings of clubs or organizations: Not on file    Relationship status: Not on file  Other Topics Concern  . 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  ROS:   All other ROS reviewed and negative. Pertinent positives noted in the HPI.     EKGs/Labs/Other Studies Reviewed:   The following studies were personally reviewed by me today:  EKG:  EKG is *** ordered today.  The ekg ordered today demonstrates ***,  and was personally reviewed by me.   Recent Labs: 02/05/2019: ALT 21 07/30/2019: BUN 15; Creatinine, Ser 0.95; Hemoglobin 12.5; Platelets 260; Potassium 3.4; Sodium 140   Recent Lipid Panel    Component Value Date/Time   CHOL 183 02/05/2019 0809   TRIG 72.0 02/05/2019 0809   HDL 43.20 02/05/2019 0809   CHOLHDL 4 02/05/2019 0809   VLDL 14.4 02/05/2019 0809   LDLCALC 126 (H) 02/05/2019 0809    Physical Exam:   VS:  There were no vitals taken for this visit.   Wt Readings from Last 3 Encounters:  08/06/19 242 lb (109.8 kg)  07/30/19 244 lb (110.7 kg)  07/24/19 244 lb 9.6 oz (110.9 kg)    General: Well nourished, well developed, in no acute distress Heart: Atraumatic, normal size  Eyes: PEERLA, EOMI  Neck: Supple, no JVD Endocrine: No thryomegaly Cardiac: Normal S1, S2;  RRR; no murmurs, rubs, or gallops Lungs: Clear to auscultation bilaterally, no wheezing, rhonchi or rales  Abd: Soft, nontender, no hepatomegaly  Ext: No edema, pulses 2+ Musculoskeletal: No deformities, BUE and BLE strength normal and equal Skin: Warm and dry, no rashes   Neuro: Alert and oriented to person, place, time, and situation, CNII-XII grossly intact, no focal deficits  Psych: Normal mood and affect   ASSESSMENT:   Dylan Webster is a 43 y.o. male who presents for the following: No diagnosis found.  PLAN:   There are no diagnoses linked to this encounter.  Disposition: No follow-ups on file.  Medication Adjustments/Labs and Tests Ordered: Current medicines are reviewed at length with the patient today.  Concerns regarding medicines are outlined above.  No orders of the defined types were placed in this encounter.  No orders of the defined types were placed in this encounter.   There are no Patient Instructions on file for this visit.   Signed, Addison Naegeli. Audie Box, [place]  [address], [place], [place] [address] [phone]  09/14/2019 11:35 AM

## 2019-09-15 ENCOUNTER — Ambulatory Visit: Payer: BC Managed Care – PPO | Admitting: Cardiovascular Disease

## 2019-10-09 NOTE — Progress Notes (Deleted)
Cardiology Office Note:   Date:  10/09/2019  NAME:  Dylan Webster    MRN: [id number] DOB:  1976/02/05   PCP:  Jearld Fenton, NP  Cardiologist:  No primary care provider on file.  Electrophysiologist:  None   Referring MD: Jearld Fenton, NP   No chief complaint on file. ***  History of Present Illness:   Dylan Webster is a 44 y.o. male with a hx of obesity who is being seen today for the evaluation of chest pain at the request of Jearld Fenton, NP.  Seen 07/30/2019 for atypical CP. EKG with early repol and troponin negative. Attributed to MSK etiology.   Past Medical History: Past Medical History:  Diagnosis Date  . Allergy     Past Surgical History: Past Surgical History:  Procedure Laterality Date  . NO PAST SURGERIES      Current Medications: No outpatient medications have been marked as taking for the 10/12/19 encounter (Appointment) with O'Neal, Cassie Freer, MD.     Allergies:    Patient has no known allergies.   Social History: Social History   Socioeconomic History  . Marital status: Single    Spouse name: Not on file  . Number of children: Not on file  . Years of education: Not on file  . Highest education level: Not on file  Occupational History  . Not on file  Tobacco Use  . Smoking status: Former Smoker    Packs/day: 0.50    Types: Cigarettes    Quit date: 12/22/2018    Years since quitting: 0.7  . Smokeless tobacco: Never Used  . Tobacco comment: quit 12/2018  Substance and Sexual Activity  . Alcohol use: No  . Drug use: No  . Sexual activity: Yes    Birth control/protection: None  Other Topics Concern  . Not on file  Social History Narrative   ** Merged History Encounter **       Married, 4 children  Job: [employer]  Will start exercising regularly   Social Determinants of Health   Financial Resource Strain:   . Difficulty of Paying Living Expenses: Not on file  Food Insecurity:   . Worried About Sales executive in the Last Year: Not on file  . Ran Out of Food in the Last Year: Not on file  Transportation Needs:   . Lack of Transportation (Medical): Not on file  . Lack of Transportation (Non-Medical): Not on file  Physical Activity:   . Days of Exercise per Week: Not on file  . Minutes of Exercise per Session: Not on file  Stress:   . Feeling of Stress : Not on file  Social Connections:   . Frequency of Communication with Friends and Family: Not on file  . Frequency of Social Gatherings with Friends and Family: Not on file  . Attends Religious Services: Not on file  . Active Member of Clubs or Organizations: Not on file  . Attends Archivist Meetings: Not on file  . Marital Status: Not on file     Family History: The patient's ***family history includes Alcohol abuse in his father, mother, and paternal grandfather; Diabetes in his mother and paternal grandfather; Heart disease in his father, paternal grandfather, and paternal uncle; Hyperlipidemia in his maternal aunt and paternal grandfather; Hypertension in his paternal grandfather and paternal uncle; Kidney disease in his paternal grandfather; Leukemia in his maternal aunt.  ROS:   All other ROS  reviewed and negative. Pertinent positives noted in the HPI.     EKGs/Labs/Other Studies Reviewed:   The following studies were personally reviewed by me today:  EKG:  EKG is *** ordered today.  The ekg ordered today demonstrates ***, and was personally reviewed by me.   Recent Labs: 02/05/2019: ALT 21 07/30/2019: BUN 15; Creatinine, Ser 0.95; Hemoglobin 12.5; Platelets 260; Potassium 3.4; Sodium 140   Recent Lipid Panel    Component Value Date/Time   CHOL 183 02/05/2019 0809   TRIG 72.0 02/05/2019 0809   HDL 43.20 02/05/2019 0809   CHOLHDL 4 02/05/2019 0809   VLDL 14.4 02/05/2019 0809   LDLCALC 126 (H) 02/05/2019 0809    Physical Exam:   VS:  There were no vitals taken for this visit.   Wt Readings from Last 3  Encounters:  08/06/19 242 lb (109.8 kg)  07/30/19 244 lb (110.7 kg)  07/24/19 244 lb 9.6 oz (110.9 kg)    General: Well nourished, well developed, in no acute distress Heart: Atraumatic, normal size  Eyes: PEERLA, EOMI  Neck: Supple, no JVD Endocrine: No thryomegaly Cardiac: Normal S1, S2; RRR; no murmurs, rubs, or gallops Lungs: Clear to auscultation bilaterally, no wheezing, rhonchi or rales  Abd: Soft, nontender, no hepatomegaly  Ext: No edema, pulses 2+ Musculoskeletal: No deformities, BUE and BLE strength normal and equal Skin: Warm and dry, no rashes   Neuro: Alert and oriented to person, place, time, and situation, CNII-XII grossly intact, no focal deficits  Psych: Normal mood and affect   ASSESSMENT:   Dylan Webster is a 44 y.o. male who presents for the following: No diagnosis found.  PLAN:   There are no diagnoses linked to this encounter.  Disposition: No follow-ups on file.  Medication Adjustments/Labs and Tests Ordered: Current medicines are reviewed at length with the patient today.  Concerns regarding medicines are outlined above.  No orders of the defined types were placed in this encounter.  No orders of the defined types were placed in this encounter.   There are no Patient Instructions on file for this visit.   Signed, Addison Naegeli. Audie Box, [place]  [address], [place], [place] [address] [phone]  10/09/2019 8:52 AM

## 2019-10-12 ENCOUNTER — Ambulatory Visit: Payer: BC Managed Care – PPO | Admitting: Cardiovascular Disease

## 2019-11-18 ENCOUNTER — Emergency Department (HOSPITAL_COMMUNITY): Payer: BC Managed Care – PPO

## 2019-11-18 ENCOUNTER — Encounter (HOSPITAL_COMMUNITY): Payer: Self-pay | Admitting: *Deleted

## 2019-11-18 ENCOUNTER — Other Ambulatory Visit: Payer: Self-pay

## 2019-11-18 ENCOUNTER — Emergency Department (HOSPITAL_COMMUNITY)
Admission: EM | Admit: 2019-11-18 | Discharge: 2019-11-18 | Disposition: A | Discharge status: Home / Self Care | Payer: BC Managed Care – PPO | Attending: Emergency Medicine | Admitting: Emergency Medicine

## 2019-11-18 ENCOUNTER — Telehealth: Payer: Self-pay | Admitting: Internal Medicine

## 2019-11-18 DIAGNOSIS — R079 Chest pain, unspecified: Secondary | ICD-10-CM

## 2019-11-18 DIAGNOSIS — Z79899 Other long term (current) drug therapy: Secondary | ICD-10-CM | POA: Diagnosis not present

## 2019-11-18 DIAGNOSIS — I10 Essential (primary) hypertension: Secondary | ICD-10-CM | POA: Insufficient documentation

## 2019-11-18 DIAGNOSIS — Z87891 Personal history of nicotine dependence: Secondary | ICD-10-CM | POA: Insufficient documentation

## 2019-11-18 LAB — BASIC METABOLIC PANEL
Anion gap: 14 (ref 5–15)
BUN: 12 mg/dL (ref 6–20)
CO2: 20 mmol/L — ABNORMAL LOW (ref 22–32)
Calcium: 9.5 mg/dL (ref 8.9–10.3)
Chloride: 102 mmol/L (ref 98–111)
Creatinine, Ser: 0.84 mg/dL (ref 0.61–1.24)
GFR calc Af Amer: >60 mL/min (ref >60)
GFR calc non Af Amer: >60 mL/min (ref >60)
Glucose, Bld: 89 mg/dL (ref 70–99)
Potassium: 3.7 mmol/L (ref 3.5–5.1)
Sodium: 136 mmol/L (ref 135–145)

## 2019-11-18 LAB — CBC
HCT: 40.2 % (ref 39.0–52.0)
Hemoglobin: 14 g/dL (ref 13.0–17.0)
MCH: 31.1 pg (ref 26.0–34.0)
MCHC: 34.8 g/dL (ref 30.0–36.0)
MCV: 89.3 fL (ref 80.0–100.0)
Platelets: 273 10*3/uL (ref 150–400)
RBC: 4.5 MIL/uL (ref 4.22–5.81)
RDW: 11.8 % (ref 11.5–15.5)
WBC: 8.1 10*3/uL (ref 4.0–10.5)
nRBC: 0 % (ref 0.0–0.2)

## 2019-11-18 LAB — TROPONIN I (HIGH SENSITIVITY)
Troponin I (High Sensitivity): 2 ng/L (ref <18)
Troponin I (High Sensitivity): 4 ng/L (ref <18)

## 2019-11-18 MED ORDER — SODIUM CHLORIDE 0.9% FLUSH: 3.0000 mL | Freq: Once | INTRAVENOUS | Status: DC

## 2019-11-18 MED ORDER — KETOROLAC TROMETHAMINE 10 MG PO TABS
10.0000 mg | ORAL_TABLET | Freq: Once | ORAL | Status: AC
  Administered 2019-11-18: 10 mg via ORAL
  Filled 2019-11-18: qty 1

## 2019-11-18 NOTE — Telephone Encounter (Signed)
Called patient today in regards to appointment he scheduled on my chart for Friday. Patient stated that he had his wife check in BP yesterday and he did not know exact numbers but it was around 190/50-something/ He stated she checked it again and the numbers dropped.  Patient said he is having a headache and slight cough.  Patient said that he is also having pain in his lower shoulder near his back., he stated that Monday he had sweats and felt like his was having an anxiety attack.  He stated he does have periods that feel like his is light headed.  No Fever, sore throat,SOB, Vomiting, diarrhea, congestion or runny nose.    Would he be able to come in?  And do you want [place] to go ahead and have him triage

## 2019-11-18 NOTE — ED Provider Notes (Addendum)
[facility] DEPT Provider Note   CSN: [id number] Arrival date & time: 11/18/19  1420     History Chief Complaint  Patient presents with  . Chest Pain    Dylan Webster is a 44 y.o. male. Presented to ER with chief complaint chest pain. Patient reports Monday he had an episode of chest pain that seem to radiate down his right arm, states sharp, stabbing sensation, lasted for a few minutes. Occurred while at rest, not associated with exertion. Resolved spontaneously. Yesterday did not have any episodes. 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 There was no significant physical exertion associated with this activity. This time seem to be more left-sided radiating down left arm. Lasting for a few minutes, then resolving, then coming back for few seconds at a time. Also sharp. Currently not having any chest pain. No shortness of breath associated with the symptoms.  On ROS, also endorsed mild headache.  Reports has frequent headaches, this is not as severe as many other headaches, not sudden onset, dull achy.  Past medical history notable for former smoker.  Family history notable for heart disease in father. Father had heart problems, died of pneumonia at 51.   HPI     Past Medical History:  Diagnosis Date  . Allergy     Patient Active Problem List   Diagnosis Date Noted  . OSA (obstructive sleep apnea) 04/01/2019  . Morbid (severe) obesity due to excess calories ([place]) 02/24/2019  . Hypertension, essential 02/24/2019    Past Surgical History:  Procedure Laterality Date  . NO PAST SURGERIES         Family History  Problem Relation Age of Onset  . Alcohol abuse Father   . Heart disease Father   . Alcohol abuse Mother   . Diabetes Mother   . Hyperlipidemia Maternal Aunt   . Heart disease Paternal Uncle   . Hypertension Paternal Uncle   . Alcohol abuse Paternal Grandfather   . Hyperlipidemia  Paternal Grandfather   . Heart disease Paternal Grandfather   . Hypertension Paternal Grandfather   . Diabetes Paternal Grandfather   . Kidney disease Paternal Grandfather   . Leukemia Maternal Aunt     Social History   Tobacco Use  . Smoking status: Former Smoker    Packs/day: 0.50    Types: Cigarettes    Quit date: 12/22/2018    Years since quitting: 0.9  . Smokeless tobacco: Never Used  . Tobacco comment: quit 12/2018  Substance Use Topics  . Alcohol use: No  . Drug use: No    Home Medications Prior to Admission medications   Medication Sig Start Date End Date Taking? Authorizing Provider  acetaminophen (TYLENOL) 325 MG tablet Take 650 mg by mouth every 6 (six) hours as needed for moderate pain.    Yes [provider]  Arginine 1000 MG TABS Take 1 tablet by mouth 2 (two) times daily.   Yes [provider]  ibuprofen (ADVIL,MOTRIN) 200 MG tablet Take 800 mg by mouth daily as needed for moderate pain.    Yes [provider]  magnesium 30 MG tablet Take 30 mg by mouth 2 (two) times daily.   Yes [provider]  meloxicam (MOBIC) 15 MG tablet Take 15 mg by mouth daily as needed for pain.  11/13/19  Yes [provider]  Potassium 99 MG TABS Take 1 tablet by mouth daily.   Yes [provider]    Allergies    Patient has no known allergies.  Review of Systems   Review of Systems  Constitutional: Negative for chills and fever.  HENT: Negative for ear pain and sore throat.   Eyes: Negative for pain and visual disturbance.  Respiratory: Negative for cough and shortness of breath.   Cardiovascular: Positive for chest pain. Negative for palpitations.  Gastrointestinal: Negative for abdominal pain and vomiting.  Genitourinary: Negative for dysuria and hematuria.  Musculoskeletal: Negative for arthralgias and back pain.  Skin: Negative for color change and rash.  Neurological: Positive for headaches. Negative for seizures and  syncope.  All other systems reviewed and are negative.   Physical Exam Updated Vital Signs BP (!) 150/83 (BP Location: Left Arm)   Pulse 74   Temp (!) 97.5 F (36.4 C) (Oral)   Resp (!) 24   SpO2 97%   Physical Exam Vitals and nursing note reviewed.  Constitutional:      Appearance: He is well-developed.  HENT:     Head: Normocephalic and atraumatic.  Eyes:     Conjunctiva/sclera: Conjunctivae normal.  Cardiovascular:     Rate and Rhythm: Normal rate and regular rhythm.     Heart sounds: No murmur.  Pulmonary:     Effort: Pulmonary effort is normal. No respiratory distress.     Breath sounds: Normal breath sounds.  Abdominal:     Palpations: Abdomen is soft.     Tenderness: There is no abdominal tenderness.  Musculoskeletal:     Cervical back: Neck supple.     Right lower leg: No tenderness. No edema.     Left lower leg: No tenderness. No edema.  Skin:    General: Skin is warm and dry.     Capillary Refill: Capillary refill takes less than 2 seconds.  Neurological:     General: No focal deficit present.     Mental Status: He is alert and oriented to person, place, and time.     Comments: 5/5 strength in UE, sensation to light touch intact in UE     ED Results / Procedures / Treatments   Labs (all labs ordered are listed, but only abnormal results are displayed) Labs Reviewed  BASIC METABOLIC PANEL - Abnormal; Notable for the following components:      Result Value   CO2 20 (*)    All other components within normal limits  CBC  TROPONIN I (HIGH SENSITIVITY)  TROPONIN I (HIGH SENSITIVITY)    EKG EKG Interpretation  Date/Time:  Wednesday November 18 2019 14:29:12 EST Ventricular Rate:  105 PR Interval:    QRS Duration: 78 QT Interval:  342 QTC Calculation: 452 R Axis:   172 Text Interpretation: Right and left arm electrode reversal, interpretation assumes no reversal Sinus tachycardia Right axis deviation Abnormal T, consider ischemia, lateral leads  Confirmed by Madalyn Rob [id number] on 11/18/2019 5:16:12 PM   Radiology DG Chest 2 View  Result Date: 11/18/2019 CLINICAL DATA:  Chest pain radiating to the right arm, diaphoresis. EXAM: CHEST - 2 VIEW COMPARISON:  07/30/2019. FINDINGS: Trachea is midline. Heart size stable. Lungs are somewhat low in volume but clear. No pleural fluid. IMPRESSION: No acute findings. Electronically Signed   By: Lorin Picket M.D.   On: 11/18/2019 14:50    Procedures Procedures (including critical care time)  Medications Ordered in ED Medications  sodium chloride flush (NS) 0.9 % injection 3 mL (has no administration in time range)  ketorolac (TORADOL) tablet 10 mg (  10 mg Oral Given 11/18/19 1823)    ED Course  I have reviewed the triage vital signs and the nursing notes.  Pertinent labs & imaging results that were available during my care of the patient were reviewed by me and considered in my medical decision making (see chart for details).    MDM Rules/Calculators/A&P                      44 year old male presented to ER with chest pain.  On exam patient noted be well-appearing, mildly hypertensive but otherwise stable vital signs.  No ongoing chest pain.  No association with exertion. EKG without new ischemic changes.  Troponin x2 within normal limits.  Doubt acute coronary syndrome.  Reported father with history of early heart disease, patient is also a former smoker.  Believe he would benefit from close outpatient cardiology follow-up.  Thankfully, patient already has appointment scheduled on Friday.  CXR without evidence for acute pulmonary pathology, he has no shortness of breath, no tachypnea, no hypoxia, doubt PE.  Will discharge home at this time.  Reviewed return precautions in detail as well as importance of follow-up with his primary doctor and cardiologist on Friday.    After the discussed management above, the patient was determined to be safe for discharge.  The patient was in  agreement with this plan and all questions regarding their care were answered.  ED return precautions were discussed and the patient will return to the ED with any significant worsening of condition.   Final Clinical Impression(s) / ED Diagnoses Final diagnoses:  Chest pain, unspecified type    Rx / DC Orders ED Discharge Orders    None       Lucrezia Starch, MD 11/18/19 2105    Lucrezia Starch, MD 11/18/19 2105

## 2019-11-18 NOTE — Telephone Encounter (Signed)
Please triage

## 2019-11-18 NOTE — ED Notes (Signed)
Pt denies any active chest pain at the moment.

## 2019-11-18 NOTE — Progress Notes (Addendum)
Cardiology Office Note:   Date:  11/20/2019  NAME:  Dylan Webster    MRN: [id number] DOB:  [birth year]   PCP:  Jearld Fenton, NP  Cardiologist:  No primary care provider on file.   Referring MD: Jearld Fenton, NP   Chief Complaint  Patient presents with  . Chest Pain    History of Present Illness:   Dylan Webster is a 44 y.o. male with a hx of hypertension who is being seen today for the evaluation of chest pain at the request of Jearld Fenton, NP.  He was evaluated in the emergency room on 07/30/2019 for atypical right-sided chest pain.  High-sensitivity troponin was negative x2.  EKG demonstrated normal sinus rhythm with early repolarization normality.  He presents for follow-up today. He reports for the past 1 to 2 years he has had periodic episodes of chest pain.  The pain is described as left-sided sharp pain.  The pain can go into his arms.  The pain can also occur in the right side of the chest.  The pain can come without any identifiable trigger.  Is not associated with exertion.  Is not alleviated by rest.  He reports the pain can last for up to 30 minutes and just resolves without any intervention.  He reports it feels like a throbbing achy pain in his chest.  It sometimes is associated with heavy lifting.  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  He has been to the emergency room twice for what I can tell.  He is ruled out for myocardial infarction twice with negative high-sensitivity troponins and normal EKGs.  His EKG today shows normal sinus rhythm without any evidence of prior infarction or acute ischemic changes.  He reports no association with food.  He does drink red bowls intermittently.  He reports he did have some excess caffeine consumption earlier this week when he went to the emergency room.  It is unclear if this triggered the episode.  He does have sleep apnea which has been diagnosed recently.  He does use his CPAP machine.   His cardiovascular examination is unremarkable today.  He does report that his father had a heart condition and was in the hospital frequently.  He has since passed.  There is no other strong family history of heart disease.  Laboratory data from last year shows an A1c of 4.9, total cholesterol 183, HDL 43, LDL 126.  There is no recent TSH in the system.  BMI is 35.  He is working on diet and exercising.  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  CVD risk factors include former tobacco abuse 12 pack years, obesity.  Past Medical History: Past Medical History:  Diagnosis Date  . Allergy     Past Surgical History: Past Surgical History:  Procedure Laterality Date  . NO PAST SURGERIES      Current Medications: Current Meds  Medication Sig  . acetaminophen (TYLENOL) 325 MG tablet Take 650 mg by mouth every 6 (six) hours as needed for moderate pain.   . Arginine 1000 MG TABS Take 1 tablet by mouth 2 (two) times daily.  Marland Kitchen ibuprofen (ADVIL,MOTRIN) 200 MG tablet Take 800 mg by mouth daily as needed for moderate pain.   . magnesium 30 MG tablet Take 30 mg by mouth 2 (two) times daily.  . meloxicam (MOBIC) 15 MG tablet Take 15 mg by  mouth daily as needed for pain.   Marland Kitchen Potassium 99 MG TABS Take 1 tablet by mouth daily.     Allergies:    Patient has no known allergies.   Social History: Social History   Socioeconomic History  . Marital status: Married    Spouse name: Not on file  . Number of children: 4  . Years of education: Not on file  . Highest education level: Not on file  Occupational History  . Occupation: maintenance  Tobacco Use  . Smoking status: Former Smoker    Packs/day: 1.00    Years: 12.00    Pack years: 12.00    Types: Cigarettes    Quit date: 12/22/2018    Years since quitting: 0.9  . Smokeless tobacco: Never Used  . Tobacco comment: quit 12/2018  Substance and Sexual Activity  . Alcohol use: No  . Drug use: No  . Sexual activity: Yes    Birth  control/protection: None  Other Topics Concern  . Not on file  Social History Narrative   ** Merged History Encounter **       Married, 4 children  Job: [employer]  Will start exercising regularly   Social Determinants of Health   Financial Resource Strain:   . Difficulty of Paying Living Expenses: Not on file  Food Insecurity:   . Worried About Charity fundraiser in the Last Year: Not on file  . Ran Out of Food in the Last Year: Not on file  Transportation Needs:   . Lack of Transportation (Medical): Not on file  . Lack of Transportation (Non-Medical): Not on file  Physical Activity:   . Days of Exercise per Week: Not on file  . Minutes of Exercise per Session: Not on file  Stress:   . Feeling of Stress : Not on file  Social Connections:   . Frequency of Communication with Friends and Family: Not on file  . Frequency of Social Gatherings with Friends and Family: Not on file  . Attends Religious Services: Not on file  . Active Member of Clubs or Organizations: Not on file  . Attends Archivist Meetings: Not on file  . Marital Status: Not on file     Family History: The patient's family history includes Alcohol abuse in his father, mother, and paternal grandfather; Diabetes in his mother and paternal grandfather; Heart disease in his father, paternal grandfather, and paternal uncle; Hyperlipidemia in his maternal aunt and paternal grandfather; Hypertension in his paternal grandfather and paternal uncle; Kidney disease in his paternal grandfather; Leukemia in his maternal aunt.  ROS:   All other ROS reviewed and negative. Pertinent positives noted in the HPI.     EKGs/Labs/Other Studies Reviewed:   The following studies were personally reviewed by me today:  EKG:  EKG is ordered today.  The ekg ordered today demonstrates normal sinus rhythm, heart rate 64, no acute ST-T changes, no evidence of prior infarction, and was personally reviewed by me.    Recent Labs: 02/05/2019: ALT 21 11/18/2019: BUN 12; Creatinine, Ser 0.84; Hemoglobin 14.0; Platelets 273; Potassium 3.7; Sodium 136   Recent Lipid Panel    Component Value Date/Time   CHOL 183 02/05/2019 0809   TRIG 72.0 02/05/2019 0809   HDL 43.20 02/05/2019 0809   CHOLHDL 4 02/05/2019 0809   VLDL 14.4 02/05/2019 0809   LDLCALC 126 (H) 02/05/2019 0809    Physical Exam:   VS:  BP 130/74 (BP Location: Right Arm)  Ht 5\' 8"  (1.727 m)   Wt 235 lb 12.8 oz (107 kg)   SpO2 98%   BMI 35.85 kg/m    Wt Readings from Last 3 Encounters:  11/20/19 235 lb 12.8 oz (107 kg)  08/06/19 242 lb (109.8 kg)  07/30/19 244 lb (110.7 kg)    General: Well nourished, well developed, in no acute distress Heart: Atraumatic, normal size  Eyes: PEERLA, EOMI  Neck: Supple, no JVD Endocrine: No thryomegaly Cardiac: Normal S1, S2; RRR; no murmurs, rubs, or gallops Lungs: Clear to auscultation bilaterally, no wheezing, rhonchi or rales  Abd: Soft, nontender, no hepatomegaly  Ext: No edema, pulses 2+ Musculoskeletal: No deformities, BUE and BLE strength normal and equal Skin: Warm and dry, no rashes   Neuro: Alert and oriented to person, place, time, and situation, CNII-XII grossly intact, no focal deficits  Psych: Normal mood and affect   ASSESSMENT:   Dylan Webster is a 44 y.o. male who presents for the following: 1. Chest pain, unspecified type   2. Essential hypertension   3. Obesity (BMI 30-39.9)   4. Obstructive sleep apnea syndrome     PLAN:   1. Chest pain, unspecified type -He presents with atypical chest pain.  EKG today normal without any acute ischemic changes or evidence of prior infarction.  Cardiovascular examination today is without murmurs he has no evidence of heart failure today.  I suspect this is all musculoskeletal.  Given he has been to the emergency room twice and he is concerned given his family history of father who had heart disease, we will proceed with cardiac CTA to  exclude obstructive CAD.  He will take metoprolol tartrate 100 mg 2 hours before scan.  We will also get a BMP 1 week before the scan.  I will also add on a TSH at this time.  He has not had one recently.  I think this is necessary to provide him further reassurance that this is not his heart.  He will take ibuprofen and Tylenol as needed for now.  He will also work to reduce caffeine consumption.  His main cardiovascular disease risk factors include obesity and former smoking.  Blood pressure today slightly elevated but not needing treatment.  He also has sleep apnea which has just started treatment.  This could be contributing to his symptoms as well.   2. Essential hypertension -Slightly elevated today.  Will work on diet and exercise.  Also will have his sleep apnea treated.  3. Obesity (BMI 30-39.9) -Counseled on the importance of diet and exercise.  4. Obstructive sleep apnea syndrome -Continue to use CPAP.  Disposition: Return in about 3 months (around 02/17/2020).  Medication Adjustments/Labs and Tests Ordered: Current medicines are reviewed at length with the patient today.  Concerns regarding medicines are outlined above.  Orders Placed This Encounter  Procedures  . CT CORONARY MORPH W/CTA COR W/SCORE W/CA W/CM &/OR WO/CM  . CT CORONARY FRACTIONAL FLOW RESERVE DATA PREP  . CT CORONARY FRACTIONAL FLOW RESERVE FLUID ANALYSIS  . TSH  . Basic metabolic panel  . EKG 12-Lead  . ECHOCARDIOGRAM COMPLETE   Meds ordered this encounter  Medications  . metoprolol tartrate (LOPRESSOR) 50 MG tablet    Sig: Take 2 tablet (100 mg) by mouth once for procedure.    Dispense:  2 tablet    Refill:  0    Patient Instructions  Medication Instructions:  Take Metoprolol 100 mg 2 hours before your CT when scheduled.  *If  you need a refill on your cardiac medications before your next appointment, please call your pharmacy*  Lab Work: BMET, TSH one week before CT when scheduled  If you have  labs (blood work) drawn today and your tests are completely normal, you will receive your results only by: Marland Kitchen MyChart Message (if you have MyChart) OR . A paper copy in the mail If you have any lab test that is abnormal or we need to change your treatment, we will call you to review the results.  Testing/Procedures: Echocardiogram - Your physician has requested that you have an echocardiogram. Echocardiography is a painless test that uses sound waves to create images of your heart. It provides your doctor with information about the size and shape of your heart and how well your heart's chambers and valves are working. This procedure takes approximately one hour. There are no restrictions for this procedure. This will be performed at our [facility] location - [address], [address].  Your physician has requested that you have cardiac CT. Cardiac computed tomography (CT) is a painless test that uses an x-ray machine to take clear, detailed pictures of your heart. For further information please visit [facility]uge[facility]iesta[facility]tn. Please follow instruction sheet as given.   Follow-Up: At [facility], you and your health needs are our priority.  As part of our continuing mission to provide you with exceptional heart care, we have created designated Provider Care Teams.  These Care Teams include your primary Cardiologist (physician) and Advanced Practice Providers (APPs -  Physician Assistants and Nurse Practitioners) who all work together to provide you with the care you need, when you need it.  Your next appointment:   3 month(s)  The format for your next appointment:   Virtual Visit   Provider:   Eleonore Chiquito, MD  Other Instructions  Your cardiac CT will be scheduled at one of the below locations:   [facility] [address] [place], [place] [address] [phone]  If scheduled at [facility], please arrive at the [facility] main entrance of [facility] 30  minutes prior to test start time. Proceed to the [facility] Radiology Department (first floor) to check-in and test prep.  Please follow these instructions carefully (unless otherwise directed):  Hold all erectile dysfunction medications at least 3 days (72 hrs) prior to test.  On the Night Before the Test: . Be sure to Drink plenty of water. . Do not consume any caffeinated/decaffeinated beverages or chocolate 12 hours prior to your test. . Do not take any antihistamines 12 hours prior to your test. . If you take Metformin do not take 24 hours prior to test.  On the Day of the Test: . Drink plenty of water. Do not drink any water within one hour of the test. . Do not eat any food 4 hours prior to the test. . You may take your regular medications prior to the test.  . Take metoprolol (Lopressor) two hours prior to test. . HOLD Furosemide/Hydrochlorothiazide morning of the test. . FEMALES- please wear underwire-free bra if available       After the Test: . Drink plenty of water. . After receiving IV contrast, you may experience a mild flushed feeling. This is normal. . On occasion, you may experience a mild rash up to 24 hours after the test. This is not dangerous. If this occurs, you can take Benadryl 25 mg and increase your fluid intake. . If you experience  trouble breathing, this can be serious. If it is severe call 911 IMMEDIATELY. If it is mild, please call our office. . If you take any of these medications: Glipizide/Metformin, Avandament, Glucavance, please do not take 48 hours after completing test unless otherwise instructed.   Once we have confirmed authorization from your insurance company, we will call you to set up a date and time for your test.   For non-scheduling related questions, please contact the cardiac imaging nurse navigator should you have any questions/concerns: Marchia Bond, RN Navigator Cardiac Imaging [facility] [phone]  mobile       Signed, Addison Naegeli. Audie Box, [place]  [address], [place], [place] [address] [phone]  11/20/2019 9:46 AM

## 2019-11-18 NOTE — Telephone Encounter (Signed)
Patient's wife called back stating that he is in the ER now. Advised her that Webb Silversmith NP is aware and will review note once completed.

## 2019-11-18 NOTE — Telephone Encounter (Signed)
I called pt back and mail box is full and could not leave a message for pt to call [facility]. I called pt's wife (on DPR) and left v/m requesting cb.

## 2019-11-18 NOTE — ED Triage Notes (Signed)
Monday started with chest pain that travels to his rt upper arm, some sweating  Stated. Pt verifies it is similar to his chest pain in the past.

## 2019-11-18 NOTE — Telephone Encounter (Signed)
Per chart review tab pt is at [facility] ED. FYI to Avie Echevaria NP.

## 2019-11-18 NOTE — Discharge Instructions (Addendum)
Recommend taking Tylenol, anti-inflammatory such as naproxen or Motrin for pain control.  If you have worsening chest pain, any difficulty in breathing, passing out, other new concerning symptom, recommend return to ER for reassessment.  Please follow-up with the cardiology team as well as your primary doctor as previously scheduled on Friday.  Future Appointments  Date Time Provider [place]  11/20/2019  8:40 AM O'Neal, Cassie Freer, MD [facility]  11/20/2019  3:45 PM Jearld Fenton, NP [facility]  01/25/2020  2:00 PM Deneise Lever, MD [facility] None

## 2019-11-18 NOTE — Telephone Encounter (Signed)
Noted, will review ER note once complete.

## 2019-11-20 ENCOUNTER — Encounter: Payer: Self-pay | Admitting: Cardiovascular Disease

## 2019-11-20 ENCOUNTER — Ambulatory Visit: Payer: BC Managed Care – PPO | Admitting: Internal Medicine

## 2019-11-20 ENCOUNTER — Other Ambulatory Visit: Payer: Self-pay

## 2019-11-20 ENCOUNTER — Ambulatory Visit: Payer: BC Managed Care – PPO | Admitting: Cardiovascular Disease

## 2019-11-20 ENCOUNTER — Encounter: Payer: Self-pay | Admitting: Internal Medicine

## 2019-11-20 VITALS — BP 130/74 | HR 64 | Ht 68.0 in | Wt 235.8 lb

## 2019-11-20 VITALS — BP 134/80 | HR 69 | Temp 97.9°F | Wt 239.0 lb

## 2019-11-20 DIAGNOSIS — I10 Essential (primary) hypertension: Secondary | ICD-10-CM | POA: Diagnosis not present

## 2019-11-20 DIAGNOSIS — E669 Obesity, unspecified: Secondary | ICD-10-CM | POA: Diagnosis not present

## 2019-11-20 DIAGNOSIS — G4733 Obstructive sleep apnea (adult) (pediatric): Secondary | ICD-10-CM | POA: Diagnosis not present

## 2019-11-20 DIAGNOSIS — F419 Anxiety disorder, unspecified: Secondary | ICD-10-CM

## 2019-11-20 DIAGNOSIS — R42 Dizziness and giddiness: Secondary | ICD-10-CM | POA: Diagnosis not present

## 2019-11-20 DIAGNOSIS — R0789 Other chest pain: Secondary | ICD-10-CM

## 2019-11-20 DIAGNOSIS — R079 Chest pain, unspecified: Secondary | ICD-10-CM | POA: Diagnosis not present

## 2019-11-20 DIAGNOSIS — R519 Headache, unspecified: Secondary | ICD-10-CM | POA: Diagnosis not present

## 2019-11-20 MED ORDER — METOPROLOL TARTRATE 50 MG PO TABS: ORAL_TABLET | ORAL | 0 refills | Status: DC

## 2019-11-20 MED ORDER — AMLODIPINE BESYLATE 5 MG PO TABS: 5.0000 mg | ORAL_TABLET | Freq: Every day | ORAL | 0 refills | Status: DC

## 2019-11-20 NOTE — Patient Instructions (Signed)

## 2019-11-20 NOTE — Patient Instructions (Signed)
Medication Instructions:  Take Metoprolol 100 mg 2 hours before your CT when scheduled.  *If you need a refill on your cardiac medications before your next appointment, please call your pharmacy*  Lab Work: BMET, TSH one week before CT when scheduled  If you have labs (blood work) drawn today and your tests are completely normal, you will receive your results only by: Marland Kitchen MyChart Message (if you have MyChart) OR . A paper copy in the mail If you have any lab test that is abnormal or we need to change your treatment, we will call you to review the results.  Testing/Procedures: Echocardiogram - Your physician has requested that you have an echocardiogram. Echocardiography is a painless test that uses sound waves to create images of your heart. It provides your doctor with information about the size and shape of your heart and how well your heart's chambers and valves are working. This procedure takes approximately one hour. There are no restrictions for this procedure. This will be performed at our [facility] location - [address].  Your physician has requested that you have cardiac CT. Cardiac computed tomography (CT) is a painless test that uses an x-ray machine to take clear, detailed pictures of your heart. For further information please visit [email or web address]. Please follow instruction sheet as given.   Follow-Up: At [facility], you and your health needs are our priority.  As part of our continuing mission to provide you with exceptional heart care, we have created designated Provider Care Teams.  These Care Teams include your primary Cardiologist (physician) and Advanced Practice Providers (APPs -  Physician Assistants and Nurse Practitioners) who all work together to provide you with the care you need, when you need it.  Your next appointment:   3 month(s)  The format for your next appointment:   Virtual Visit   Provider:   Eleonore Chiquito, MD  Other  Instructions  Your cardiac CT will be scheduled at one of the below locations:   [facility] [address] [place], [place] [address] [phone]  If scheduled at [facility], please arrive at the [facility] main entrance of [facility] 30 minutes prior to test start time. Proceed to the [facility] Radiology Department (first floor) to check-in and test prep.  Please follow these instructions carefully (unless otherwise directed):  Hold all erectile dysfunction medications at least 3 days (72 hrs) prior to test.  On the Night Before the Test: . Be sure to Drink plenty of water. . Do not consume any caffeinated/decaffeinated beverages or chocolate 12 hours prior to your test. . Do not take any antihistamines 12 hours prior to your test. . If you take Metformin do not take 24 hours prior to test.  On the Day of the Test: . Drink plenty of water. Do not drink any water within one hour of the test. . Do not eat any food 4 hours prior to the test. . You may take your regular medications prior to the test.  . Take metoprolol (Lopressor) two hours prior to test. . HOLD Furosemide/Hydrochlorothiazide morning of the test. . FEMALES- please wear underwire-free bra if available       After the Test: . Drink plenty of water. . After receiving IV contrast, you may experience a mild flushed feeling. This is normal. . On occasion, you may experience a mild rash up to 24 hours after the test. This is not dangerous. If this occurs,  you can take Benadryl 25 mg and increase your fluid intake. . If you experience trouble breathing, this can be serious. If it is severe call 911 IMMEDIATELY. If it is mild, please call our office. . If you take any of these medications: Glipizide/Metformin, Avandament, Glucavance, please do not take 48 hours after completing test unless otherwise instructed.   Once we have confirmed authorization from your insurance company, we will  call you to set up a date and time for your test.   For non-scheduling related questions, please contact the cardiac imaging nurse navigator should you have any questions/concerns: Marchia Bond, RN Navigator Cardiac Imaging [facility] [phone] mobile

## 2019-11-20 NOTE — Progress Notes (Signed)
Subjective:    Patient ID: Dylan Webster, male    DOB: 06-29-1976, 44 y.o.   MRN: [id number]  HPI  Pt presents to the clinic today for ER follow up. He went to the ER 2/0/21 with c/o chest pain. He deescribes the chest pain as sharp, radiating down either his right arm or left arm, depending on the episode. The pain resolved spontaneously and did not occur with exertion. He was mildly hypertensive in the ER, had been complaining of intermittent headaches. ECG was normal. Chest xray was normal. Troponin's were negative. He was given a Toradol injection. He was discharged and advised to follow up with cardiology, had an appt earlier today. Since discharge, he followed up with cardiology today. They seem to think it may be a muscle strain of his chest wall. They were also going to order an Echo and CT calcium score. He has recently cut out carbs and started exercising.  Review of Systems  Past Medical History:  Diagnosis Date  . Allergy     Current Outpatient Medications  Medication Sig Dispense Refill  . acetaminophen (TYLENOL) 325 MG tablet Take 650 mg by mouth every 6 (six) hours as needed for moderate pain.     . Arginine 1000 MG TABS Take 1 tablet by mouth 2 (two) times daily.    Marland Kitchen ibuprofen (ADVIL,MOTRIN) 200 MG tablet Take 800 mg by mouth daily as needed for moderate pain.     . magnesium 30 MG tablet Take 30 mg by mouth 2 (two) times daily.    . meloxicam (MOBIC) 15 MG tablet Take 15 mg by mouth daily as needed for pain.     . metoprolol tartrate (LOPRESSOR) 50 MG tablet Take 2 tablet (100 mg) by mouth once for procedure. 2 tablet 0  . Potassium 99 MG TABS Take 1 tablet by mouth daily.     No current facility-administered medications for this visit.    No Known Allergies  Family History  Problem Relation Age of Onset  . Alcohol abuse Father   . Heart disease Father   . Alcohol abuse Mother   . Diabetes Mother   . Hyperlipidemia Maternal Aunt   . Heart disease Paternal Uncle    . Hypertension Paternal Uncle   . Alcohol abuse Paternal Grandfather   . Hyperlipidemia Paternal Grandfather   . Heart disease Paternal Grandfather   . Hypertension Paternal Grandfather   . Diabetes Paternal Grandfather   . Kidney disease Paternal Grandfather   . Leukemia Maternal Aunt     Social History   Socioeconomic History  . Marital status: Married    Spouse name: Not on file  . Number of children: 4  . Years of education: Not on file  . Highest education level: Not on file  Occupational History  . Occupation: maintenance  Tobacco Use  . Smoking status: Former Smoker    Packs/day: 1.00    Years: 12.00    Pack years: 12.00    Types: Cigarettes    Quit date: 12/22/2018    Years since quitting: 0.9  . Smokeless tobacco: Never Used  . Tobacco comment: quit 12/2018  Substance and Sexual Activity  . Alcohol use: No  . Drug use: No  . Sexual activity: Yes    Birth control/protection: None  Other Topics Concern  . Not on file  Social History Narrative   ** Merged History Encounter **       Married, 4 children  Job: [employer]  Francene Finders  Will start exercising regularly   Social Determinants of Health   Financial Resource Strain:   . Difficulty of Paying Living Expenses: Not on file  Food Insecurity:   . Worried About Charity fundraiser in the Last Year: Not on file  . Ran Out of Food in the Last Year: Not on file  Transportation Needs:   . Lack of Transportation (Medical): Not on file  . Lack of Transportation (Non-Medical): Not on file  Physical Activity:   . Days of Exercise per Week: Not on file  . Minutes of Exercise per Session: Not on file  Stress:   . Feeling of Stress : Not on file  Social Connections:   . Frequency of Communication with Friends and Family: Not on file  . Frequency of Social Gatherings with Friends and Family: Not on file  . Attends Religious Services: Not on file  . Active Member of Clubs or Organizations: Not on file  .  Attends Archivist Meetings: Not on file  . Marital Status: Not on file  Intimate Partner Violence:   . Fear of Current or Ex-Partner: Not on file  . Emotionally Abused: Not on file  . Physically Abused: Not on file  . Sexually Abused: Not on file     Constitutional: Denies fever, malaise, fatigue, headache or abrupt weight changes.  Respiratory: Denies difficulty breathing, shortness of breath, cough or sputum production.   Cardiovascular: Pt reports intermittent chest pain. Denies chest tightness, palpitations or swelling in the hands or feet.  Gastrointestinal: Denies abdominal pain, bloating, constipation, diarrhea or blood in the stool.  Musculoskeletal: Denies decrease in range of motion, difficulty with gait, muscle pain or joint pain and swelling.  Skin: Denies redness, rashes, lesions or ulcercations.  Neurological: Pt reports intermittent lightheadedness. Denies dizziness, difficulty with memory, difficulty with speech or problems with balance and coordination.  Psych: Pt reports mild anxiety. Denies depression, SI/HI.  No other specific complaints in a complete review of systems (except as listed in HPI above).     Objective:   Physical Exam  BP 134/80   Pulse 69   Temp 97.9 F (36.6 C) (Temporal)   Wt 239 lb (108.4 kg)   SpO2 98%   BMI 36.34 kg/m   Wt Readings from Last 3 Encounters:  11/20/19 235 lb 12.8 oz (107 kg)  08/06/19 242 lb (109.8 kg)  07/30/19 244 lb (110.7 kg)    General: Appears his stated age, obese, in NAD. Cardiovascular: Normal rate and rhythm. S1,S2 noted.  No murmur, rubs or gallops noted. No JVD or BLE edema. No carotid bruits noted. Pulmonary/Chest: Normal effort and positive vesicular breath sounds. No respiratory distress. No wheezes, rales or ronchi noted.  Musculoskeletal: No difficulty with gait.  Neurological: Alert and oriented. . Coordination normal.  Psychiatric: Mood and affect normal. Behavior is normal. Judgment and  thought content normal.     BMET    Component Value Date/Time   NA 136 11/18/2019 1446   K 3.7 11/18/2019 1446   CL 102 11/18/2019 1446   CO2 20 (L) 11/18/2019 1446   GLUCOSE 89 11/18/2019 1446   BUN 12 11/18/2019 1446   CREATININE 0.84 11/18/2019 1446   CALCIUM 9.5 11/18/2019 1446   GFRNONAA >60 11/18/2019 1446   GFRAA >60 11/18/2019 1446    Lipid Panel     Component Value Date/Time   CHOL 183 02/05/2019 0809   TRIG 72.0 02/05/2019 0809   HDL 43.20 02/05/2019 0809  CHOLHDL 4 02/05/2019 0809   VLDL 14.4 02/05/2019 0809   LDLCALC 126 (H) 02/05/2019 0809    CBC    Component Value Date/Time   WBC 8.1 11/18/2019 1446   RBC 4.50 11/18/2019 1446   HGB 14.0 11/18/2019 1446   HCT 40.2 11/18/2019 1446   PLT 273 11/18/2019 1446   MCV 89.3 11/18/2019 1446   MCH 31.1 11/18/2019 1446   MCHC 34.8 11/18/2019 1446   RDW 11.8 11/18/2019 1446   LYMPHSABS 3.4 05/24/2011 0613   MONOABS 0.8 05/24/2011 0613   EOSABS 0.2 05/24/2011 0613   BASOSABS 0.0 05/24/2011 0613    Hgb A1C Lab Results  Component Value Date   HGBA1C 4.9 02/05/2019           Assessment & Plan:   ER Follow up for HTN, Frequent Headaches, Lightheadedness, Chest Pain and Anxiety:  ER notes, labs and imaging reviewed RX for Amlodipine 5 mg PO daily Keep your appt for Echo and CT calcium score Encouraged low carb, low salt, low saturated fat diet and increase in aerobic exercise for weight loss Follow up with cardiology as planned Encouraged distraction/deep breathing exercises when he first notices symptoms- if this is anxiety, this could help.   RTC in 2 weeks for HTN followup  Webb Silversmith, NP This visit occurred during the SARS-CoV-2 public health emergency.  Safety protocols were in place, including screening questions prior to the visit, additional usage of staff PPE, and extensive cleaning of exam room while observing appropriate contact time as indicated for disinfecting solutions.

## 2019-12-03 ENCOUNTER — Other Ambulatory Visit: Payer: Self-pay

## 2019-12-03 ENCOUNTER — Ambulatory Visit (HOSPITAL_COMMUNITY): Payer: BC Managed Care – PPO | Attending: Cardiovascular Disease

## 2019-12-03 DIAGNOSIS — R079 Chest pain, unspecified: Secondary | ICD-10-CM | POA: Diagnosis present

## 2019-12-10 ENCOUNTER — Encounter: Payer: Self-pay | Admitting: Internal Medicine

## 2019-12-29 ENCOUNTER — Encounter (HOSPITAL_COMMUNITY): Payer: Self-pay

## 2019-12-31 ENCOUNTER — Ambulatory Visit (HOSPITAL_COMMUNITY)
Admission: RE | Admit: 2019-12-31 | Discharge: 2019-12-31 | Disposition: A | Discharge status: Home / Self Care | Payer: BC Managed Care – PPO | Source: Ambulatory Visit | Attending: Cardiovascular Disease | Admitting: Cardiovascular Disease

## 2019-12-31 ENCOUNTER — Other Ambulatory Visit: Payer: Self-pay

## 2019-12-31 ENCOUNTER — Encounter (HOSPITAL_COMMUNITY): Payer: Self-pay

## 2019-12-31 ENCOUNTER — Encounter: Payer: BC Managed Care – PPO | Admitting: *Deleted

## 2019-12-31 DIAGNOSIS — R079 Chest pain, unspecified: Secondary | ICD-10-CM

## 2019-12-31 DIAGNOSIS — Z006 Encounter for examination for normal comparison and control in clinical research program: Secondary | ICD-10-CM

## 2019-12-31 LAB — BASIC METABOLIC PANEL
BUN/Creatinine Ratio: 21 — ABNORMAL HIGH (ref 9–20)
BUN: 16 mg/dL (ref 6–24)
CO2: 22 mmol/L (ref 20–29)
Calcium: 9.6 mg/dL (ref 8.7–10.2)
Chloride: 105 mmol/L (ref 96–106)
Creatinine, Ser: 0.77 mg/dL (ref 0.76–1.27)
GFR calc Af Amer: 128 mL/min/{1.73_m2} (ref >59)
GFR calc non Af Amer: 111 mL/min/{1.73_m2} (ref >59)
Glucose: 127 mg/dL — ABNORMAL HIGH (ref 65–99)
Potassium: 4.1 mmol/L (ref 3.5–5.2)
Sodium: 141 mmol/L (ref 134–144)

## 2019-12-31 LAB — TSH: TSH: 0.732 u[IU]/mL (ref 0.450–4.500)

## 2019-12-31 MED ORDER — NITROGLYCERIN 0.4 MG SL SUBL
0.8000 mg | SUBLINGUAL_TABLET | Freq: Once | SUBLINGUAL | Status: AC
  Administered 2019-12-31: 0.8 mg via SUBLINGUAL

## 2019-12-31 MED ORDER — NITROGLYCERIN 0.4 MG SL SUBL
SUBLINGUAL_TABLET | SUBLINGUAL | Status: AC
  Filled 2019-12-31: qty 2

## 2019-12-31 MED ORDER — IOHEXOL 350 MG/ML SOLN
80.0000 mL | Freq: Once | INTRAVENOUS | Status: AC | PRN
  Administered 2019-12-31: 08:00:00 80 mL via INTRAVENOUS

## 2019-12-31 NOTE — Research (Signed)
CADFEM Informed Consent                  Subject Name:   Dylan Webster   Subject met inclusion and exclusion criteria.  The informed consent form, study requirements and expectations were reviewed with the subject and questions and concerns were addressed prior to the signing of the consent form.  The subject verbalized understanding of the trial requirements.  The subject agreed to participate in the CADFEM trial and signed the informed consent.  The informed consent was obtained prior to performance of any protocol-specific procedures for the subject.  A copy of the signed informed consent was given to the subject and a copy was placed in the subject's medical record.   [place] Kamyah Wilhelmsen, Research Assistant 12/31/2019 07:32

## 2020-01-25 ENCOUNTER — Ambulatory Visit: Payer: BC Managed Care – PPO | Admitting: Internal Medicine

## 2020-01-25 ENCOUNTER — Other Ambulatory Visit: Payer: Self-pay

## 2020-01-25 ENCOUNTER — Encounter: Payer: Self-pay | Admitting: Internal Medicine

## 2020-01-25 DIAGNOSIS — I10 Essential (primary) hypertension: Secondary | ICD-10-CM | POA: Diagnosis not present

## 2020-01-25 DIAGNOSIS — G4733 Obstructive sleep apnea (adult) (pediatric): Secondary | ICD-10-CM | POA: Diagnosis not present

## 2020-01-25 NOTE — Patient Instructions (Signed)
We can continue CPAP auto 10-18, mask of choice, humidifier, supplies, AirView/ card  Please call us or [employer] if you need help

## 2020-01-25 NOTE — Progress Notes (Signed)
HPI M former smoker followed for OSA, complicated by HBP, Hypercholesterolemia,  NPSG 04/01/2019- AHI 93.3/ hr, desaturation to 75%, body weight 244 lbs  -------------------------------------------------------------------------------------------  [id number] 43 yoM former smoker, married, Architectural technologist, for sleep evaluation referred by PCP for snoring, wife reports coughing & choking in middle of night, never done sleep study Medical problems include HBP, Hypercholesterolemia,  He gives hx of snoring, witnessed apneas. No ENT surgery, lung or heart problems.  Family OSA- father and brother Body weight today 244 lbs Weight increase when he quit smoking. Some daytime sleepiness.. 1 caffeine tab in the morning- doesn't like coffee. Bedtime 10:30, waking 2-3 times before up 5:30 AM.   01/25/20- 44 yoM former smoker followed for OSA, complicated by HBP, Hypercholesterolemia,  NPSG 04/01/2019- AHI 93.3/ hr, desaturation to 75%, body weight 244 lbs CPAP auto 10-18/ [employer]- Body weight today 231 lbs Occasionally on-call for work with CPAP unavailable those nights. Had good cardiac CT w/u for chest pain.  ROS-see HPI   + = positive Constitutional:    weight loss, night sweats, fevers, chills,+ fatigue, lassitude. HEENT:    headaches, difficulty swallowing, tooth/dental problems, sore throat,       sneezing, itching, ear ache, nasal congestion, post nasal drip, snoring CV:    chest pain, orthopnea, PND, swelling in lower extremities, anasarca,                                  dizziness, palpitations Resp:   shortness of breath with exertion or at rest.                productive cough,   non-productive cough, coughing up of blood.              change in color of mucus.  wheezing.   Skin:    rash or lesions. GI:  No-   heartburn, indigestion, abdominal pain, nausea, vomiting, diarrhea,                 change in bowel habits, loss of appetite GU: dysuria, change in color of urine, no  urgency or frequency.   flank pain. MS:   joint pain, stiffness, decreased range of motion, back pain. Neuro-     nothing unusual Psych:  change in mood or affect.  depression or anxiety.   memory loss.  OBJ- Physical Exam General- Alert, Oriented, Affect-appropriate, Distress- none acute, + obese Skin- rash-none, lesions- none, excoriation- none Lymphadenopathy- none Head- atraumatic            Eyes- Gross vision intact, PERRLA, conjunctivae and secretions clear            Ears- Hearing, canals-normal            Nose- Clear, no-Septal dev, mucus, polyps, erosion, perforation             Throat- Mallampati IV, mucosa clear , drainage- none, tonsils- atrophic, + teeth Neck- flexible , trachea midline, no stridor , thyroid nl, carotid no bruit Chest - symmetrical excursion , unlabored           Heart/CV- RRR , no murmur , no gallop  , no rub, nl s1 s2                           - JVD- none , edema- none, stasis changes- none, varices- none  Lung- clear to P&A, wheeze- none, cough- none , dullness-none, rub- none           Chest wall-  Abd-  Br/ Gen/ Rectal- Not done, not indicated Extrem- cyanosis- none, clubbing, none, atrophy- none, strength- nl Neuro- grossly intact to observation

## 2020-02-14 ENCOUNTER — Encounter: Payer: Self-pay | Admitting: Cardiovascular Disease

## 2020-02-14 NOTE — Progress Notes (Signed)
Cardiology Office Note:   Date:  02/15/2020  NAME:  Dylan Webster    MRN: [id number] DOB:  08/18/1976   PCP:  Jearld Fenton, NP  Cardiologist:  No primary care provider on file.   Referring MD: Jearld Fenton, NP   Chief Complaint  Patient presents with  . Follow-up   History of Present Illness:   Dylan Webster is a 44 y.o. male with a hx of HTN who presents for follow-up of atypical chest pain. CCTA negative. Echo normal.  He is reassured and is heart studies are normal.  He reports that he still gets intermittent central sharp chest pain.  It can last seconds.  Can occur anytime.  No identifiable trigger no alleviating factor.  He reports he may think it is gas or acid reflux.  He has modified his diet with some improvement in symptoms.  Symptoms occur once every month.  Blood pressure bit elevated today 148/90.  He reports he checks it at home and is in the 130s.  He reports he was prescribed medication by stomach care physician but he is trying to do this with diet and exercise.  He reports that he will plan to exercise more in the summer months.  He has worked on cutting down salt as well as carbohydrates.  Most recent LDL cholesterol 126.  I did advise him that he should continue to diet and exercise moving forward in life.  Seems to be pretty healthy overall.  He does not smoke.  All of his recent laboratory data are unremarkable.  Past Medical History: Past Medical History:  Diagnosis Date  . Allergy   . HTN (hypertension)     Past Surgical History: Past Surgical History:  Procedure Laterality Date  . NO PAST SURGERIES      Current Medications: Current Meds  Medication Sig  . acetaminophen (TYLENOL) 325 MG tablet Take 650 mg by mouth every 6 (six) hours as needed for moderate pain.   Marland Kitchen ibuprofen (ADVIL,MOTRIN) 200 MG tablet Take 800 mg by mouth daily as needed for moderate pain.   . magnesium 30 MG tablet Take 30 mg by mouth 2 (two) times daily.  . meloxicam (MOBIC) 15  MG tablet Take 15 mg by mouth daily as needed for pain.   Marland Kitchen Potassium 99 MG TABS Take 1 tablet by mouth daily.     Allergies:    Patient has no known allergies.   Social History: Social History   Socioeconomic History  . Marital status: Married    Spouse name: Not on file  . Number of children: 4  . Years of education: Not on file  . Highest education level: Not on file  Occupational History  . Occupation: maintenance  Tobacco Use  . Smoking status: Former Smoker    Packs/day: 1.00    Years: 12.00    Pack years: 12.00    Types: Cigarettes    Quit date: 12/22/2018    Years since quitting: 1.1  . Smokeless tobacco: Never Used  . Tobacco comment: quit 12/2018  Substance and Sexual Activity  . Alcohol use: No  . Drug use: No  . Sexual activity: Yes    Birth control/protection: None  Other Topics Concern  . Not on file  Social History Narrative   ** Merged History Encounter **       Married, 4 children  Job: [employer]  Will start exercising regularly   Social Determinants of Health  Financial Resource Strain:   . Difficulty of Paying Living Expenses:   Food Insecurity:   . Worried About Charity fundraiser in the Last Year:   . Arboriculturist in the Last Year:   Transportation Needs:   . Film/video editor (Medical):   Marland Kitchen Lack of Transportation (Non-Medical):   Physical Activity:   . Days of Exercise per Week:   . Minutes of Exercise per Session:   Stress:   . Feeling of Stress :   Social Connections:   . Frequency of Communication with Friends and Family:   . Frequency of Social Gatherings with Friends and Family:   . Attends Religious Services:   . Active Member of Clubs or Organizations:   . Attends Archivist Meetings:   Marland Kitchen Marital Status:      Family History: The patient's family history includes Alcohol abuse in his father, mother, and paternal grandfather; Diabetes in his mother and paternal grandfather; Heart disease  in his father, paternal grandfather, and paternal uncle; Hyperlipidemia in his maternal aunt and paternal grandfather; Hypertension in his paternal grandfather and paternal uncle; Kidney disease in his paternal grandfather; Leukemia in his maternal aunt.  ROS:   All other ROS reviewed and negative. Pertinent positives noted in the HPI.     EKGs/Labs/Other Studies Reviewed:   The following studies were personally reviewed by me today:  TTE 12/03/2019 1. Left ventricular ejection fraction, by estimation, is 60 to 65%. The  left ventricle has normal function. The left ventricle has no regional  wall motion abnormalities. Left ventricular diastolic parameters were  normal. The average left ventricular  global longitudinal strain is -19.7 %.  2. Right ventricular systolic function is normal. The right ventricular  size is normal. There is normal pulmonary artery systolic pressure. The  estimated right ventricular systolic pressure is 99991111 mmHg.  3. The mitral valve is grossly normal. No evidence of mitral valve  regurgitation. No evidence of mitral stenosis.  4. The aortic valve is tricuspid. Aortic valve regurgitation is not  visualized. No aortic stenosis is present.  5. The inferior vena cava is normal in size with greater than 50%  respiratory variability, suggesting right atrial pressure of 3 mmHg.    CCTA 12/31/2019 IMPRESSION: 1. Coronary calcium score of 0. 2. Normal coronary origin with right dominance. 3. Normal coronary arteries.  RECOMMENDATIONS: 1. No evidence of CAD (0%). Consider non-atherosclerotic causes of chest pain.  Recent Labs: 11/18/2019: Hemoglobin 14.0; Platelets 273 12/30/2019: BUN 16; Creatinine, Ser 0.77; Potassium 4.1; Sodium 141; TSH 0.732   Recent Lipid Panel    Component Value Date/Time   CHOL 183 02/05/2019 0809   TRIG 72.0 02/05/2019 0809   HDL 43.20 02/05/2019 0809   CHOLHDL 4 02/05/2019 0809   VLDL 14.4 02/05/2019 0809   LDLCALC 126 (H)  02/05/2019 0809    Physical Exam:   VS:  BP (!) 148/90   Pulse 82   Ht 5\' 9"  (1.753 m)   Wt 232 lb 12.8 oz (105.6 kg)   SpO2 98%   BMI 34.38 kg/m    Wt Readings from Last 3 Encounters:  02/15/20 232 lb 12.8 oz (105.6 kg)  01/25/20 231 lb (104.8 kg)  11/20/19 239 lb (108.4 kg)    General: Well nourished, well developed, in no acute distress Heart: Atraumatic, normal size  Eyes: PEERLA, EOMI  Neck: Supple, no JVD Endocrine: No thryomegaly Cardiac: Normal S1, S2; RRR; no murmurs, rubs, or gallops Lungs: Clear  to auscultation bilaterally, no wheezing, rhonchi or rales  Abd: Soft, nontender, no hepatomegaly  Ext: No edema, pulses 2+ Musculoskeletal: No deformities, BUE and BLE strength normal and equal Skin: Warm and dry, no rashes   Neuro: Alert and oriented to person, place, time, and situation, CNII-XII grossly intact, no focal deficits  Psych: Normal mood and affect   ASSESSMENT:   Dylan Webster is a 44 y.o. male who presents for the following: 1. Chest pain, unspecified type   2. Essential hypertension     PLAN:   1. Chest pain, unspecified type -Atypical.  Echo normal.  Cardiac CTA with normal coronary arteries.  Suspect his symptoms are likely acid reflux versus gas.  They occur infrequently.  He will continue with dietary modification for now.  He will see [place] as needed.  2. Essential hypertension -A bit elevated today.  He reports is well controlled at home.  He is trying diet and exercise for now.  He will follow with his primary care physician for this.   Disposition: Return if symptoms worsen or fail to improve.  Medication Adjustments/Labs and Tests Ordered: Current medicines are reviewed at length with the patient today.  Concerns regarding medicines are outlined above.  No orders of the defined types were placed in this encounter.  No orders of the defined types were placed in this encounter.   Patient Instructions  Medication Instructions:  The current  medical regimen is effective;  continue present plan and medications.  *If you need a refill on your cardiac medications before your next appointment, please call your pharmacy*  Follow-Up: At [facility], you and your health needs are our priority.  As part of our continuing mission to provide you with exceptional heart care, we have created designated Provider Care Teams.  These Care Teams include your primary Cardiologist (physician) and Advanced Practice Providers (APPs -  Physician Assistants and Nurse Practitioners) who all work together to provide you with the care you need, when you need it.  We recommend signing up for the patient portal called "MyChart".  Sign up information is provided on this After Visit Summary.  MyChart is used to connect with patients for Virtual Visits (Telemedicine).  Patients are able to view lab/test results, encounter notes, upcoming appointments, etc.  Non-urgent messages can be sent to your provider as well.   To learn more about what you can do with MyChart, go to [email or web address].    Your next appointment:   As needed  The format for your next appointment:   In Person  Provider:   Eleonore Chiquito, MD     Time Spent with Patient: I have spent a total of 25 minutes with patient reviewing hospital notes, telemetry, EKGs, labs and examining the patient as well as establishing an assessment and plan that was discussed with the patient.  > 50% of time was spent in direct patient care.  Signed, Addison Naegeli. Audie Box, [place]  [address], [place], [place] [address] [phone]  02/15/2020 8:35 AM

## 2020-02-15 ENCOUNTER — Ambulatory Visit: Payer: BC Managed Care – PPO | Admitting: Cardiovascular Disease

## 2020-02-15 ENCOUNTER — Other Ambulatory Visit: Payer: Self-pay

## 2020-02-15 ENCOUNTER — Encounter: Payer: Self-pay | Admitting: Cardiovascular Disease

## 2020-02-15 VITALS — BP 148/90 | HR 82 | Ht 69.0 in | Wt 232.8 lb

## 2020-02-15 DIAGNOSIS — R079 Chest pain, unspecified: Secondary | ICD-10-CM | POA: Diagnosis not present

## 2020-02-15 DIAGNOSIS — I10 Essential (primary) hypertension: Secondary | ICD-10-CM

## 2020-02-15 NOTE — Patient Instructions (Signed)
Medication Instructions:  The current medical regimen is effective;  continue present plan and medications.  *If you need a refill on your cardiac medications before your next appointment, please call your pharmacy*    Follow-Up: At [facility], you and your health needs are our priority.  As part of our continuing mission to provide you with exceptional heart care, we have created designated Provider Care Teams.  These Care Teams include your primary Cardiologist (physician) and Advanced Practice Providers (APPs -  Physician Assistants and Nurse Practitioners) who all work together to provide you with the care you need, when you need it.  We recommend signing up for the patient portal called "MyChart".  Sign up information is provided on this After Visit Summary.  MyChart is used to connect with patients for Virtual Visits (Telemedicine).  Patients are able to view lab/test results, encounter notes, upcoming appointments, etc.  Non-urgent messages can be sent to your provider as well.   To learn more about what you can do with MyChart, go to [email or web address].    Your next appointment:   As needed  The format for your next appointment:   In Person  Provider:   Bingham O'Neal, MD      

## 2020-02-17 ENCOUNTER — Telehealth: Payer: Self-pay

## 2020-02-17 ENCOUNTER — Ambulatory Visit: Payer: BC Managed Care – PPO | Admitting: Internal Medicine

## 2020-02-17 ENCOUNTER — Other Ambulatory Visit: Payer: Self-pay

## 2020-02-17 ENCOUNTER — Encounter: Payer: Self-pay | Admitting: Internal Medicine

## 2020-02-17 VITALS — BP 126/86 | HR 90 | Temp 97.3°F | Ht 68.0 in | Wt 230.4 lb

## 2020-02-17 DIAGNOSIS — F411 Generalized anxiety disorder: Secondary | ICD-10-CM

## 2020-02-17 DIAGNOSIS — F419 Anxiety disorder, unspecified: Secondary | ICD-10-CM

## 2020-02-17 MED ORDER — HYDROXYZINE HCL 10 MG PO TABS
10.0000 mg | ORAL_TABLET | Freq: Every day | ORAL | 0 refills | Status: DC | PRN
Start: 2020-02-17 — End: 2020-05-05

## 2020-02-17 NOTE — Telephone Encounter (Signed)
[place] [facility] - Client TELEPHONE ADVICE RECORD [facility] Patient Name: Dylan Webster Gender: Male DOB: 1976/07/17 Age: 44 Y 45 M 11 D Return Phone Number: [phone] (Primary) [place] [address] Client [place] [facility] - Client Client Site [place] - Day Physician Webb Silversmith - NP Contact Type Call Who Is Calling Patient / Member / Family / Caregiver Call Type Triage / Clinical Caller Name ASHLEY GREEN Relationship To Patient Spouse Return Phone Number [phone] (Primary) Chief Complaint Blood Pressure High Reason for Call Symptomatic / Request for [place] states she her husband has high BP episode. It is 152/96 now but earlier it was 175/107. He does have an appt. today later this afternoon, but wife wanted to speak to nurse too. Translation No Nurse Assessment Nurse: Delafuente, RN, Irma Date/Time (Eastern Time): 02/17/2020 9:54:56 AM Confirm and document reason for call. If symptomatic, describe symptoms. ---Caller stated her husband's BP was 175/107 this morning, recheck was 152/96. Caller stated patient has an appointment this morning. Caller stated her husband reports tingling to right arm, headache and nausea. Has the patient had close contact with a person known or suspected to have the novel coronavirus illness OR traveled / lives in area with major community spread (including international travel) in the last 14 days from the onset of symptoms? * If Asymptomatic, screen for exposure and travel within the last 14 days. ---No Does the patient have any new or worsening symptoms? ---Yes Will a triage be completed? ---Yes Related visit to physician within the last 2 weeks? ---No Does the PT have any chronic conditions? (i.e. diabetes, asthma, this includes High risk factors for pregnancy, etc.) ---Yes List chronic conditions.  ---hypertension, Is this a behavioral health or substance abuse call? ---No Guidelines Guideline Title Affirmed Question Affirmed Notes Nurse Date/Time (Eastern Time) Blood Pressure - High [id number] Systolic BP >= [id number] OR Diastolic >= 80 AND [id number] taking BP medications Delafuente, RN, Irma 02/17/2020 9:58:31 AM PLEASE NOTE: All timestamps contained within this report are represented as [place] Standard Time. CONFIDENTIALTY NOTICE: This fax transmission is intended only for the addressee. It contains information that is legally privileged, confidential or otherwise protected from use or disclosure. If you are not the intended recipient, you are strictly prohibited from reviewing, disclosing, copying using or disseminating any of this information or taking any action in reliance on or regarding this information. If you have received this fax in error, please notify us immediately by telephone so that we can arrange for its return to [place]. Phone: [phone], Toll-Free: [phone], Fax: [phone] Page: 2 of 2 Call Id: [id number] [place]. Time Eilene Ghazi Time) Disposition Final User 02/17/2020 10:01:45 AM See PCP within 2 Weeks Yes Delafuente, RN, Irma Caller Disagree/Comply Comply Caller Understands Yes PreDisposition Did not know what to do Care Advice Given Per Guideline SEE PCP WITHIN 2 WEEKS: * You need to be seen for this ongoing problem within the next 2 weeks. Call your doctor (or NP/ PA) during regular office hours and make an appointment. CALL BACK IF: * Weakness or numbness of the face, arm or leg on one side of the body occurs * Difficulty walking, difficulty talking, or severe headache occurs * Chest pain or difficulty breathing occurs * Your blood pressure is over 160/100 * You become worse. REASSURANCE AND EDUCATION: * Your blood pressure is elevated but you have told me that you are not having any symptoms. * You  should see your doctor and have your blood pressure checked within 2 weeks. *  You might need to have an adjustment in your medication(s). LIFESTYLE MODIFICATIONS: * EXERCISE, BE MORE PHYSICALLY ACTIVE: Do at least 30 minutes of aerobic exercise (e.g., brisk walking) most days of the week. Other examples of aerobic activities cycling, jogging, and swimming. * EAT HEALTHY: Eat a diet rich in fresh fruits and vegetables, dietary fiber, non-animal protein (e.g., soy), and low-fat dairy products. Avoid foods with a high content of saturated fat or cholesterol. * REDUCE STRESS: Find activities that help reduce your stress. Examples might include meditation, yoga, or even a restful walk in a park. Referrals REFERRED TO PCP OFFICE

## 2020-02-17 NOTE — Telephone Encounter (Signed)
Pt already has 30' in office appt with Avie Echevaria NP today at 4:15.

## 2020-02-17 NOTE — Telephone Encounter (Signed)
Will discuss at upcoming appt.

## 2020-02-17 NOTE — Progress Notes (Signed)
Subjective:    Patient ID: Dylan Webster, male    DOB: 1975/11/20, 44 y.o.   MRN: [id number]  HPI  Pt presents to the clinic today with c/o anxiety. He reports this has been increasing over the course of the last year. He has a lot going on. His uncle passed away last year due to a heart attack. He is scared that he might have a stroke or a heart attack. He reports he recently had a CT Calcium score of 0. He reports changes at work that have affectede him financially which is causing added stress. He reports having to tell his children the same thing over and over causes him anxiety. He denies depression, SI/HI. He has never been treated for anxiety in the past. He reports when he gets anxious, he feels pressure in his chest and has tingling in his left arm.  Review of Systems  Past Medical History:  Diagnosis Date  . Allergy   . HTN (hypertension)     Current Outpatient Medications  Medication Sig Dispense Refill  . acetaminophen (TYLENOL) 325 MG tablet Take 650 mg by mouth every 6 (six) hours as needed for moderate pain.     Marland Kitchen ibuprofen (ADVIL,MOTRIN) 200 MG tablet Take 800 mg by mouth daily as needed for moderate pain.     . magnesium 30 MG tablet Take 30 mg by mouth 2 (two) times daily.    . meloxicam (MOBIC) 15 MG tablet Take 15 mg by mouth daily as needed for pain.     Marland Kitchen Potassium 99 MG TABS Take 1 tablet by mouth daily.     No current facility-administered medications for this visit.    No Known Allergies  Family History  Problem Relation Age of Onset  . Alcohol abuse Father   . Heart disease Father   . Alcohol abuse Mother   . Diabetes Mother   . Hyperlipidemia Maternal Aunt   . Heart disease Paternal Uncle   . Hypertension Paternal Uncle   . Alcohol abuse Paternal Grandfather   . Hyperlipidemia Paternal Grandfather   . Heart disease Paternal Grandfather   . Hypertension Paternal Grandfather   . Diabetes Paternal Grandfather   . Kidney disease Paternal Grandfather    . Leukemia Maternal Aunt     Social History   Socioeconomic History  . Marital status: Married    Spouse name: Not on file  . Number of children: 4  . Years of education: Not on file  . Highest education level: Not on file  Occupational History  . Occupation: maintenance  Tobacco Use  . Smoking status: Former Smoker    Packs/day: 1.00    Years: 12.00    Pack years: 12.00    Types: Cigarettes    Quit date: 12/22/2018    Years since quitting: 1.1  . Smokeless tobacco: Never Used  . Tobacco comment: quit 12/2018  Substance and Sexual Activity  . Alcohol use: No  . Drug use: No  . Sexual activity: Yes    Birth control/protection: None  Other Topics Concern  . Not on file  Social History Narrative   ** Merged History Encounter **       Married, 4 children  Job: [employer]  Will start exercising regularly   Social Determinants of Health   Financial Resource Strain:   . Difficulty of Paying Living Expenses:   Food Insecurity:   . Worried About Charity fundraiser in the Last Year:   .  Ran Out of Food in the Last Year:   Transportation Needs:   . Film/video editor (Medical):   Marland Kitchen Lack of Transportation (Non-Medical):   Physical Activity:   . Days of Exercise per Week:   . Minutes of Exercise per Session:   Stress:   . Feeling of Stress :   Social Connections:   . Frequency of Communication with Friends and Family:   . Frequency of Social Gatherings with Friends and Family:   . Attends Religious Services:   . Active Member of Clubs or Organizations:   . Attends Archivist Meetings:   Marland Kitchen Marital Status:   Intimate Partner Violence:   . Fear of Current or Ex-Partner:   . Emotionally Abused:   Marland Kitchen Physically Abused:   . Sexually Abused:      Constitutional: Denies fever, malaise, fatigue, headache or abrupt weight changes.  HEENT: Denies eye pain, eye redness, ear pain, ringing in the ears, wax buildup, runny nose, nasal congestion,  bloody nose, or sore throat. Respiratory: Denies difficulty breathing, shortness of breath, cough or sputum production.   Cardiovascular: Pt reports chest pressure. Denies chest pain, chest tightness, palpitations or swelling in the hands or feet.  Gastrointestinal: Denies abdominal pain, bloating, constipation, diarrhea or blood in the stool.  GU: Denies urgency, frequency, pain with urination, burning sensation, blood in urine, odor or discharge. Musculoskeletal: Denies decrease in range of motion, difficulty with gait, muscle pain or joint pain and swelling.  Skin: Denies redness, rashes, lesions or ulcercations.  Neurological: Pt reports tingling in left arm. Denies dizziness, difficulty with memory, difficulty with speech or problems with balance and coordination.  Psych: Pt reports anxiety. Denies depression, SI/HI.  No other specific complaints in a complete review of systems (except as listed in HPI above).     Objective:   Physical Exam  BP 126/86 (BP Location: Left Arm, Patient Position: Sitting, Cuff Size: Normal)   Pulse 90   Temp (!) 97.3 F (36.3 C) (Temporal)   Ht 5\' 8"  (1.727 m)   Wt 230 lb 6.4 oz (104.5 kg)   SpO2 98%   BMI 35.03 kg/m   Wt Readings from Last 3 Encounters:  02/15/20 232 lb 12.8 oz (105.6 kg)  01/25/20 231 lb (104.8 kg)  11/20/19 239 lb (108.4 kg)    General: Appears his stated age, obese, in NAD. Cardiovascular: Normal rate and rhythm. S1,S2 noted.  No murmur, rubs or gallops noted.  Pulmonary/Chest: Normal effort and positive vesicular breath sounds. No respiratory distress. No wheezes, rales or ronchi noted.  Neurological: Alert and oriented.  Psychiatric: Mood and affect normal. Behavior is normal. Judgment and thought content normal.    BMET    Component Value Date/Time   NA 141 12/30/2019 1423   K 4.1 12/30/2019 1423   CL 105 12/30/2019 1423   CO2 22 12/30/2019 1423   GLUCOSE 127 (H) 12/30/2019 1423   GLUCOSE 89 11/18/2019 1446    BUN 16 12/30/2019 1423   CREATININE 0.77 12/30/2019 1423   CALCIUM 9.6 12/30/2019 1423   GFRNONAA 111 12/30/2019 1423   GFRAA 128 12/30/2019 1423    Lipid Panel     Component Value Date/Time   CHOL 183 02/05/2019 0809   TRIG 72.0 02/05/2019 0809   HDL 43.20 02/05/2019 0809   CHOLHDL 4 02/05/2019 0809   VLDL 14.4 02/05/2019 0809   LDLCALC 126 (H) 02/05/2019 0809    CBC    Component Value Date/Time   WBC 8.1  11/18/2019 1446   RBC 4.50 11/18/2019 1446   HGB 14.0 11/18/2019 1446   HCT 40.2 11/18/2019 1446   PLT 273 11/18/2019 1446   MCV 89.3 11/18/2019 1446   MCH 31.1 11/18/2019 1446   MCHC 34.8 11/18/2019 1446   RDW 11.8 11/18/2019 1446   LYMPHSABS 3.4 05/24/2011 0613   MONOABS 0.8 05/24/2011 0613   EOSABS 0.2 05/24/2011 0613   BASOSABS 0.0 05/24/2011 0613    Hgb A1C Lab Results  Component Value Date   HGBA1C 4.9 02/05/2019            Assessment & Plan:   Webb Silversmith, NP This visit occurred during the SARS-CoV-2 public health emergency.  Safety protocols were in place, including screening questions prior to the visit, additional usage of staff PPE, and extensive cleaning of exam room while observing appropriate contact time as indicated for disinfecting solutions.

## 2020-02-17 NOTE — Patient Instructions (Signed)

## 2020-02-18 ENCOUNTER — Encounter: Payer: Self-pay | Admitting: Internal Medicine

## 2020-02-18 DIAGNOSIS — F419 Anxiety disorder, unspecified: Secondary | ICD-10-CM | POA: Insufficient documentation

## 2020-02-18 NOTE — Assessment & Plan Note (Signed)
Support offered He declines SSRI therapy at this time Referral to psychology for therapy RX for Hydroxyzine 10 mg daily prn for breakthrough  Update me in 1 month and let me know how you are doing

## 2020-02-20 ENCOUNTER — Other Ambulatory Visit: Payer: Self-pay | Admitting: Internal Medicine

## 2020-02-22 NOTE — Assessment & Plan Note (Signed)
Benefits from CPAP. Plan- continue cpap 10-18, keep weight down

## 2020-02-22 NOTE — Assessment & Plan Note (Signed)
Aware of interaction between OSA and HTN Plan- managed by PCP. Continue CPAP.Marland Kitchen

## 2020-03-23 ENCOUNTER — Ambulatory Visit (INDEPENDENT_AMBULATORY_CARE_PROVIDER_SITE_OTHER): Payer: BC Managed Care – PPO | Admitting: Psychologist

## 2020-03-23 DIAGNOSIS — F411 Generalized anxiety disorder: Secondary | ICD-10-CM | POA: Diagnosis not present

## 2020-04-12 ENCOUNTER — Ambulatory Visit (INDEPENDENT_AMBULATORY_CARE_PROVIDER_SITE_OTHER): Payer: BC Managed Care – PPO | Admitting: Psychologist

## 2020-04-12 DIAGNOSIS — F411 Generalized anxiety disorder: Secondary | ICD-10-CM

## 2020-04-25 ENCOUNTER — Telehealth: Payer: Self-pay

## 2020-04-25 NOTE — Telephone Encounter (Signed)
He needs to schedule an appt to discuss

## 2020-04-25 NOTE — Telephone Encounter (Signed)
Patient contacted the office and states he was given Hydroxyzine to help with his anxiety. He states that he takes this PRN, but he notices on the days he takes it, he gets a headache, and that it is hard for him to rest when he takes it because it causes restless leg syndrome. He states he will lay down in bed and his legs feel so jumpy and feels like there is needles poking him. He is wondering if these sx are normal with this medication, and if there are any other options for him or maybe if his dose needs to be adjusted?

## 2020-04-26 ENCOUNTER — Ambulatory Visit: Payer: Self-pay

## 2020-04-26 ENCOUNTER — Encounter: Payer: Self-pay | Admitting: Orthopaedic Surgery

## 2020-04-26 ENCOUNTER — Other Ambulatory Visit: Payer: Self-pay

## 2020-04-26 ENCOUNTER — Ambulatory Visit: Payer: BC Managed Care – PPO | Admitting: Orthopaedic Surgery

## 2020-04-26 ENCOUNTER — Telehealth: Payer: Self-pay

## 2020-04-26 VITALS — Ht 68.0 in | Wt 236.0 lb

## 2020-04-26 DIAGNOSIS — M170 Bilateral primary osteoarthritis of knee: Secondary | ICD-10-CM | POA: Diagnosis not present

## 2020-04-26 DIAGNOSIS — M25561 Pain in right knee: Secondary | ICD-10-CM | POA: Diagnosis not present

## 2020-04-26 DIAGNOSIS — G8929 Other chronic pain: Secondary | ICD-10-CM

## 2020-04-26 DIAGNOSIS — M25562 Pain in left knee: Secondary | ICD-10-CM

## 2020-04-26 NOTE — Progress Notes (Signed)
Office Visit Note   Patient: Dylan Webster           Date of Birth: 09-22-1976           MRN: [id number] Visit Date: 04/26/2020              Requested by: Jearld Fenton, NP [place],  [place] [address] PCP: Jearld Fenton, NP   Assessment & Plan: Visit Diagnoses:  1. Chronic pain of both knees   2. Bilateral primary osteoarthritis of knee     Plan: Advanced primary degenerative arthritis both knees.  More symptomatic on the right.  Has tried a combination of over-the-counter medicines, Voltaren gel and an intra-articular cortisone injection by his primary care physician in October.  He notes that it really did not make that much of a difference.  He has been overweight most of his life but notes that he is probably lost 20 pounds recently with BMI  about 36.  Long discussion regarding x-ray findings and treatment options.  I think it is worth trying a course of viscosupplementation.  We will have this precertified through his insurance company and monitor response.  I do not think further imaging is necessary at this point.  I also do not think he is a good candidate for knee replacement based on his age Follow-Up Instructions: Return Pre-CERT Visco supplementation.   Orders:  Orders Placed This Encounter  Procedures  . XR KNEE 3 VIEW LEFT  . XR KNEE 3 VIEW RIGHT   No orders of the defined types were placed in this encounter.     Procedures: No procedures performed   Clinical Data: No additional findings.   Subjective: Chief Complaint  Patient presents with  . Right Knee - Pain  . Left Knee - Pain  Patient presents today for bilateral knee pain. No known injury. He states that they started hurting last year. His right knee hurts more than the left. Last fall he went to [facility] and received a cortisone injection in his right knee. He states that it helped him to get around, but the pain was still there. No swelling. His right knee grinds, pops,  and feels tight. His knees are stiff upon getting up after resting. No previous knee surgery.  He is using Voltaren gel or tylenol and Aleve.  Has some trouble going downstairs but not up.  No trouble sleeping.  Occasionally has some swelling in the right knee which is the more symptomatic.  He also notes that his father had arthritis.  He has been overweight most of his life but recently lost about 20 pounds  HPI  Review of Systems  Constitutional: Negative for fatigue.  HENT: Negative for ear pain.   Eyes: Negative for pain.  Respiratory: Negative for shortness of breath.   Cardiovascular: Negative for leg swelling.  Gastrointestinal: Positive for constipation. Negative for diarrhea.  Endocrine: Negative for cold intolerance and heat intolerance.  Genitourinary: Negative for difficulty urinating.  Musculoskeletal: Negative for joint swelling.  Skin: Negative for rash.  Allergic/Immunologic: Negative for food allergies.  Neurological: Negative for weakness.  Hematological: Does not bruise/bleed easily.  Psychiatric/Behavioral: Negative for sleep disturbance.     Objective: Vital Signs: Ht 5\' 8"  (1.727 m)   Wt 236 lb (107 kg)   BMI 35.88 kg/m   Physical Exam Constitutional:      Appearance: He is well-developed.  Eyes:     Pupils: Pupils are equal, round, and  reactive to light.  Pulmonary:     Effort: Pulmonary effort is normal.  Skin:    General: Skin is warm and dry.  Neurological:     Mental Status: He is alert and oriented to person, place, and time.  Psychiatric:        Behavior: Behavior normal.     Ortho Exam right knee had minimal effusion.  The knee was not hot.  No opening with varus valgus stress.  No posterior medial joint pain.  There was some mild anterior medial pain.  There was grinding and grating with patella motion but no particular pain with compression.  No pain laterally.  Full extension and over 105 degrees of flexion.  No popliteal pain or mass and  no calf pain.  Exam of the left knee was similar except there was less of an effusion.  There was no pain along the medial lateral joints and no popliteal mass or pain.  No calf pain no distal edema  Specialty Comments:  No specialty comments available.  Imaging: XR KNEE 3 VIEW LEFT  Result Date: 04/26/2020 Films of the left knee were obtained in 3 projections standing and are very similar to the films on the right.  There is predominately degenerative change in the medial compartment with subchondral cyst in the femoral side, narrowing of the joint space, about 2 degrees of varus and peripheral osteophytes.  There are degenerative changes in the lateral compartment and patellofemoral compartment as well.  No acute changes or ectopic calcification  XR KNEE 3 VIEW RIGHT  Result Date: 04/26/2020 Films of the right knee were obtained in 3 projections standing.  There is near bone-on-bone in the medial compartment with about 2 degrees of varus.  In the medial compartment there is subchondral sclerosis, subchondral cyst on the femoral side and peripheral osteophytes.  Laterally there are peripheral osteophytes but the joint is well-maintained.  No ectopic calcification or acute change.  Patella tracked in the midline.  Patellofemoral joint spaces are well-maintained but there are peripheral osteophytes    PMFS History: Patient Active Problem List   Diagnosis Date Noted  . Bilateral primary osteoarthritis of knee 04/26/2020  . Anxiety 02/18/2020  . OSA (obstructive sleep apnea) 04/01/2019  . Hypertension, essential 02/24/2019   Past Medical History:  Diagnosis Date  . Allergy   . HTN (hypertension)     Family History  Problem Relation Age of Onset  . Alcohol abuse Father   . Heart disease Father   . Alcohol abuse Mother   . Diabetes Mother   . Hyperlipidemia Maternal Aunt   . Heart disease Paternal Uncle   . Hypertension Paternal Uncle   . Alcohol abuse Paternal Grandfather   .  Hyperlipidemia Paternal Grandfather   . Heart disease Paternal Grandfather   . Hypertension Paternal Grandfather   . Diabetes Paternal Grandfather   . Kidney disease Paternal Grandfather   . Leukemia Maternal Aunt     Past Surgical History:  Procedure Laterality Date  . NO PAST SURGERIES     Social History   Occupational History  . Occupation: maintenance  Tobacco Use  . Smoking status: Former Smoker    Packs/day: 1.00    Years: 12.00    Pack years: 12.00    Types: Cigarettes    Quit date: 12/22/2018    Years since quitting: 1.3  . Smokeless tobacco: Never Used  . Tobacco comment: quit 12/2018  Substance and Sexual Activity  . Alcohol use: No  .  Drug use: No  . Sexual activity: Yes    Birth control/protection: None

## 2020-04-26 NOTE — Telephone Encounter (Signed)
Noted  

## 2020-04-26 NOTE — Telephone Encounter (Signed)
Please precert for right knee visco injections. This is Dr.Whitfield's patient. Thanks!  

## 2020-04-27 ENCOUNTER — Encounter: Payer: Self-pay | Admitting: Internal Medicine

## 2020-04-27 ENCOUNTER — Ambulatory Visit: Payer: BC Managed Care – PPO | Admitting: Internal Medicine

## 2020-04-27 DIAGNOSIS — I10 Essential (primary) hypertension: Secondary | ICD-10-CM

## 2020-04-27 DIAGNOSIS — F419 Anxiety disorder, unspecified: Secondary | ICD-10-CM

## 2020-04-27 MED ORDER — BUSPIRONE HCL 5 MG PO TABS: 5.0000 mg | ORAL_TABLET | Freq: Two times a day (BID) | ORAL | 0 refills | Status: DC | PRN

## 2020-04-27 NOTE — Progress Notes (Signed)
Subjective:    Patient ID: Dylan Webster, male    DOB: Aug 05, 1976, 44 y.o.   MRN: [id number]  HPI  Pt presents to the clinic today for follow up of anxiety. He was prescribed Hydroxyzine to take as needed 02/2020. He reports when he takes this, it like he has a headache and restless legs. The headache is located in the back of his head and radiates up into the top of his head and temples. He does have some intermittent lightheadedness but denies dizziness, visual changes, sensitivity to light or sound. He report when he would lay down at night and start to fall asleep, his legs would jerk. He also felt like his mood was worse, having more trouble relaxing. He has been seeing a therapist.  Of note, his BP today is 146/96. He is prescribed Amlodipine but has been out for the last week.   Review of Systems      Past Medical History:  Diagnosis Date  . Allergy   . HTN (hypertension)     Current Outpatient Medications  Medication Sig Dispense Refill  . acetaminophen (TYLENOL) 325 MG tablet Take 650 mg by mouth every 6 (six) hours as needed for moderate pain.     Marland Kitchen amLODipine (NORVASC) 5 MG tablet TAKE 1 TABLET BY MOUTH EVERY DAY 30 tablet 2  . hydrOXYzine (ATARAX/VISTARIL) 10 MG tablet Take 1 tablet (10 mg total) by mouth daily as needed. 20 tablet 0  . ibuprofen (ADVIL,MOTRIN) 200 MG tablet Take 800 mg by mouth daily as needed for moderate pain.     . magnesium 30 MG tablet Take 30 mg by mouth 2 (two) times daily.    . meloxicam (MOBIC) 15 MG tablet Take 15 mg by mouth daily as needed for pain.     Marland Kitchen Potassium 99 MG TABS Take 1 tablet by mouth daily.     No current facility-administered medications for this visit.    No Known Allergies  Family History  Problem Relation Age of Onset  . Alcohol abuse Father   . Heart disease Father   . Alcohol abuse Mother   . Diabetes Mother   . Hyperlipidemia Maternal Aunt   . Heart disease Paternal Uncle   . Hypertension Paternal Uncle   .  Alcohol abuse Paternal Grandfather   . Hyperlipidemia Paternal Grandfather   . Heart disease Paternal Grandfather   . Hypertension Paternal Grandfather   . Diabetes Paternal Grandfather   . Kidney disease Paternal Grandfather   . Leukemia Maternal Aunt     Social History   Socioeconomic History  . Marital status: Married    Spouse name: Not on file  . Number of children: 4  . Years of education: Not on file  . Highest education level: Not on file  Occupational History  . Occupation: maintenance  Tobacco Use  . Smoking status: Former Smoker    Packs/day: 1.00    Years: 12.00    Pack years: 12.00    Types: Cigarettes    Quit date: 12/22/2018    Years since quitting: 1.3  . Smokeless tobacco: Never Used  . Tobacco comment: quit 12/2018  Substance and Sexual Activity  . Alcohol use: No  . Drug use: No  . Sexual activity: Yes    Birth control/protection: None  Other Topics Concern  . Not on file  Social History Narrative   ** Merged History Encounter **       Married, 4 children  Job: Archivist [employer]  Will start exercising regularly   Social Determinants of Health   Financial Resource Strain:   . Difficulty of Paying Living Expenses:   Food Insecurity:   . Worried About Charity fundraiser in the Last Year:   . Arboriculturist in the Last Year:   Transportation Needs:   . Film/video editor (Medical):   Marland Kitchen Lack of Transportation (Non-Medical):   Physical Activity:   . Days of Exercise per Week:   . Minutes of Exercise per Session:   Stress:   . Feeling of Stress :   Social Connections:   . Frequency of Communication with Friends and Family:   . Frequency of Social Gatherings with Friends and Family:   . Attends Religious Services:   . Active Member of Clubs or Organizations:   . Attends Archivist Meetings:   Marland Kitchen Marital Status:   Intimate Partner Violence:   . Fear of Current or Ex-Partner:   . Emotionally Abused:   Marland Kitchen Physically  Abused:   . Sexually Abused:      Constitutional: Pt reports intermittent headache. Denies fever, malaise, fatigue, or abrupt weight changes.  Respiratory: Denies difficulty breathing, shortness of breath, cough or sputum production.   Cardiovascular: Denies chest pain, chest tightness, palpitations or swelling in the hands or feet.  Musculoskeletal: Denies decrease in range of motion, difficulty with gait, muscle pain or joint pain and swelling.  Skin: Denies redness, rashes, lesions or ulcercations.  Neurological: Pt reports restless legs, intermittent lightheadedness. Denies dizziness, difficulty with memory, difficulty with speech or problems with balance and coordination.  Psych: Pt reports anxiety. Denies depression, SI/HI.  No other specific complaints in a complete review of systems (except as listed in HPI above).  Objective:   Physical Exam BP (!) 146/96   Pulse 86   Temp 98.3 F (36.8 C) (Temporal)   Wt 222 lb (100.7 kg)   SpO2 98%   BMI 33.75 kg/m   Wt Readings from Last 3 Encounters:  04/26/20 236 lb (107 kg)  02/17/20 230 lb 6.4 oz (104.5 kg)  02/15/20 232 lb 12.8 oz (105.6 kg)    General: Appears his stated age, obese, in NAD. Cardiovascular: Normal rate and rhythm. S1,S2 noted.  No murmur, rubs or gallops noted. Pulmonary/Chest: Normal effort and positive vesicular breath sounds. No respiratory distress. No wheezes, rales or ronchi noted.  Neurological: Alert and oriented.  Psychiatric: Mood and affect normal. Behavior is normal. Judgment and thought content normal.     BMET    Component Value Date/Time   NA 141 12/30/2019 1423   K 4.1 12/30/2019 1423   CL 105 12/30/2019 1423   CO2 22 12/30/2019 1423   GLUCOSE 127 (H) 12/30/2019 1423   GLUCOSE 89 11/18/2019 1446   BUN 16 12/30/2019 1423   CREATININE 0.77 12/30/2019 1423   CALCIUM 9.6 12/30/2019 1423   GFRNONAA 111 12/30/2019 1423   GFRAA 128 12/30/2019 1423    Lipid Panel     Component Value  Date/Time   CHOL 183 02/05/2019 0809   TRIG 72.0 02/05/2019 0809   HDL 43.20 02/05/2019 0809   CHOLHDL 4 02/05/2019 0809   VLDL 14.4 02/05/2019 0809   LDLCALC 126 (H) 02/05/2019 0809    CBC    Component Value Date/Time   WBC 8.1 11/18/2019 1446   RBC 4.50 11/18/2019 1446   HGB 14.0 11/18/2019 1446   HCT 40.2 11/18/2019 1446   PLT 273 11/18/2019 1446  MCV 89.3 11/18/2019 1446   MCH 31.1 11/18/2019 1446   MCHC 34.8 11/18/2019 1446   RDW 11.8 11/18/2019 1446   LYMPHSABS 3.4 05/24/2011 0613   MONOABS 0.8 05/24/2011 0613   EOSABS 0.2 05/24/2011 0613   BASOSABS 0.0 05/24/2011 0613    Hgb A1C Lab Results  Component Value Date   HGBA1C 4.9 02/05/2019            Assessment & Plan:     Webb Silversmith, NP This visit occurred during the SARS-CoV-2 public health emergency.  Safety protocols were in place, including screening questions prior to the visit, additional usage of staff PPE, and extensive cleaning of exam room while observing appropriate contact time as indicated for disinfecting solutions.

## 2020-04-27 NOTE — Assessment & Plan Note (Signed)
Amlodipine refilled today Reinforced DASH diet and exercise for weight loss

## 2020-04-27 NOTE — Patient Instructions (Signed)

## 2020-04-27 NOTE — Assessment & Plan Note (Signed)
Will d/c Hydralazine due to side effects RX for Buspar 5 mg BID prn Continue seeing your therapist

## 2020-05-03 ENCOUNTER — Ambulatory Visit: Payer: Self-pay | Admitting: Psychologist

## 2020-05-05 ENCOUNTER — Ambulatory Visit: Payer: BC Managed Care – PPO | Admitting: Cardiovascular Disease

## 2020-05-05 ENCOUNTER — Encounter: Payer: Self-pay | Admitting: Cardiovascular Disease

## 2020-05-05 ENCOUNTER — Other Ambulatory Visit: Payer: Self-pay

## 2020-05-05 VITALS — BP 138/78 | HR 64 | Ht 68.0 in | Wt 221.2 lb

## 2020-05-05 DIAGNOSIS — I10 Essential (primary) hypertension: Secondary | ICD-10-CM

## 2020-05-05 DIAGNOSIS — Z8249 Family history of ischemic heart disease and other diseases of the circulatory system: Secondary | ICD-10-CM

## 2020-05-05 DIAGNOSIS — R079 Chest pain, unspecified: Secondary | ICD-10-CM | POA: Diagnosis not present

## 2020-05-05 NOTE — Progress Notes (Signed)
Cardiology Office Note:   Date:  05/05/2020  NAME:  Dylan Webster    MRN: [id number] DOB:  [birth year]   PCP:  Jearld Fenton, NP  Cardiologist:  No primary care provider on file.  Electrophysiologist:  None   Referring MD: Jearld Fenton, NP   Chief Complaint  Patient presents with  . Follow-up   History of Present Illness:   Dylan Webster is a 44 y.o. male with a hx of hypertension who presents for follow-up.  He reports that his uncle was recently diagnosed with hypertrophic cardiomyopathy.  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  He presents to discuss his need for genetic testing.  He does not have hypertrophic cardiomyopathy based on his recent heart ultrasound.  He reports overall has been doing well.  Has been exercising more.  He is lost nearly 20 pound since her last visit.  He denies any symptoms of significant chest pain or shortness of breath.  He still can get an achiness in his chest and I encouraged him to take Prilosec over-the-counter.  He reports no shortness of breath or worry about.  Blood pressure today is well controlled.  He is without other symptoms.  Past Medical History: Past Medical History:  Diagnosis Date  . Allergy   . HTN (hypertension)     Past Surgical History: Past Surgical History:  Procedure Laterality Date  . NO PAST SURGERIES      Current Medications: Current Meds  Medication Sig  . acetaminophen (TYLENOL) 325 MG tablet Take 650 mg by mouth every 6 (six) hours as needed for moderate pain.   Marland Kitchen amLODipine (NORVASC) 5 MG tablet TAKE 1 TABLET BY MOUTH EVERY DAY  . busPIRone (BUSPAR) 5 MG tablet Take 1 tablet (5 mg total) by mouth 2 (two) times daily as needed.  Marland Kitchen ibuprofen (ADVIL,MOTRIN) 200 MG tablet Take 800 mg by mouth daily as needed for moderate pain.   . meloxicam (MOBIC) 15 MG tablet Take 15 mg by mouth daily as needed for pain.      Allergies:    Patient has no known allergies.   Social History: Social  History   Socioeconomic History  . Marital status: Married    Spouse name: Not on file  . Number of children: 4  . Years of education: Not on file  . Highest education level: Not on file  Occupational History  . Occupation: maintenance  Tobacco Use  . Smoking status: Former Smoker    Packs/day: 1.00    Years: 12.00    Pack years: 12.00    Types: Cigarettes    Quit date: 12/22/2018    Years since quitting: 1.3  . Smokeless tobacco: Never Used  . Tobacco comment: quit 12/2018  Substance and Sexual Activity  . Alcohol use: No  . Drug use: No  . Sexual activity: Yes    Birth control/protection: None  Other Topics Concern  . Not on file  Social History Narrative   ** Merged History Encounter **       Married, 4 children  Job: [employer]  Will start exercising regularly   Social Determinants of Health   Financial Resource Strain:   . Difficulty of Paying Living Expenses:   Food Insecurity:   . Worried About Charity fundraiser in the Last Year:   . Arboriculturist in the Last Year:   Transportation Needs:   . Lack of Transportation (  Medical):   Marland Kitchen Lack of Transportation (Non-Medical):   Physical Activity:   . Days of Exercise per Week:   . Minutes of Exercise per Session:   Stress:   . Feeling of Stress :   Social Connections:   . Frequency of Communication with Friends and Family:   . Frequency of Social Gatherings with Friends and Family:   . Attends Religious Services:   . Active Member of Clubs or Organizations:   . Attends Archivist Meetings:   Marland Kitchen Marital Status:      Family History: The patient's family history includes Alcohol abuse in his father, mother, and paternal grandfather; Diabetes in his mother and paternal grandfather; Heart disease in his father, paternal grandfather, and paternal uncle; Hyperlipidemia in his maternal aunt and paternal grandfather; Hypertension in his paternal grandfather and paternal uncle; Kidney disease  in his paternal grandfather; Leukemia in his maternal aunt.  ROS:   All other ROS reviewed and negative. Pertinent positives noted in the HPI.     EKGs/Labs/Other Studies Reviewed:   The following studies were personally reviewed by me today:  TTE 12/03/2019 1. Left ventricular ejection fraction, by estimation, is 60 to 65%. The  left ventricle has normal function. The left ventricle has no regional  wall motion abnormalities. Left ventricular diastolic parameters were  normal. The average left ventricular  global longitudinal strain is -19.7 %.  2. Right ventricular systolic function is normal. The right ventricular  size is normal. There is normal pulmonary artery systolic pressure. The  estimated right ventricular systolic pressure is 24.4 mmHg.  3. The mitral valve is grossly normal. No evidence of mitral valve  regurgitation. No evidence of mitral stenosis.  4. The aortic valve is tricuspid. Aortic valve regurgitation is not  visualized. No aortic stenosis is present.  5. The inferior vena cava is normal in size with greater than 50%  respiratory variability, suggesting right atrial pressure of 3 mmHg.   CCTA 12/31/2019 IMPRESSION: 1. Coronary calcium score of 0.  2. Normal coronary origin with right dominance.  3. Normal coronary arteries.  RECOMMENDATIONS: 1. No evidence of CAD (0%). Consider non-atherosclerotic causes of chest pain.  Recent Labs: 11/18/2019: Hemoglobin 14.0; Platelets 273 12/30/2019: BUN 16; Creatinine, Ser 0.77; Potassium 4.1; Sodium 141; TSH 0.732   Recent Lipid Panel    Component Value Date/Time   CHOL 183 02/05/2019 0809   TRIG 72.0 02/05/2019 0809   HDL 43.20 02/05/2019 0809   CHOLHDL 4 02/05/2019 0809   VLDL 14.4 02/05/2019 0809   LDLCALC 126 (H) 02/05/2019 0809    Physical Exam:   VS:  BP (!) 138/78   Pulse 64   Ht 5\' 8"  (1.727 m)   Wt (!) 221 lb 3.2 oz (100.3 kg)   SpO2 99%   BMI 33.63 kg/m    Wt Readings from Last 3  Encounters:  05/05/20 (!) 221 lb 3.2 oz (100.3 kg)  04/27/20 222 lb (100.7 kg)  04/26/20 236 lb (107 kg)    General: Well nourished, well developed, in no acute distress Heart: Atraumatic, normal size  Eyes: PEERLA, EOMI  Neck: Supple, no JVD Endocrine: No thryomegaly Cardiac: Normal S1, S2; RRR; no murmurs, rubs, or gallops Lungs: Clear to auscultation bilaterally, no wheezing, rhonchi or rales  Abd: Soft, nontender, no hepatomegaly  Ext: No edema, pulses 2+ Musculoskeletal: No deformities, BUE and BLE strength normal and equal Skin: Warm and dry, no rashes   Neuro: Alert and oriented to person, place, time,  and situation, CNII-XII grossly intact, no focal deficits  Psych: Normal mood and affect   ASSESSMENT:   Dylan Webster is a 44 y.o. male who presents for the following: 1. Chest pain, unspecified type   2. Essential hypertension   3. Family history of heart disease     PLAN:   1. Chest pain, unspecified type -Atypical likely GERD related.  We will pursue acid reflux medication.  Normal coronary CTA and normal echo in the past.  2. Essential hypertension -Blood pressure well controlled today.  Continue amlodipine.  He lost about 20 pounds and I suspect he will continue to have better blood pressure control with weight loss.  3. Family history of heart disease -Main presentation today was to discuss recent diagnosis of hypertrophic cardiomyopathy and his uncle.  He reports that his uncle tested positive for the MYBPC3 gene.  His most recent echocardiogram shows no evidence of hypertrophic cardiomyopathy.  I did discuss with him the benefits of genetic testing.  To be tested negative for this he will be free and clear.  Otherwise he will continue with periodic echocardiogram every 3 to 5 years.  We did go over the negative consequences of genetic testing which would include exclusion from life insurance policies and other possible negative consequences such as psychiatric or  psychosocial issues.  For now he reports he would like to get his finances arranged and pursue possible increasing of his life insurance before he would pursue a genetic testing.  I think this is reasonable.  He does have 4 children and it would be nice to know if he is genotypically negative as this would require no further screening.  For now we will see him back in a year and discuss if he is interested in genetic testing.  Regardless, we will proceed with phenotypic screening with periodic echocardiogram until he has decided he would like to be genetically screened.   Disposition: Return in about 1 year (around 05/05/2021).  Medication Adjustments/Labs and Tests Ordered: Current medicines are reviewed at length with the patient today.  Concerns regarding medicines are outlined above.  No orders of the defined types were placed in this encounter.  No orders of the defined types were placed in this encounter.   Patient Instructions  Medication Instructions:  The current medical regimen is effective;  continue present plan and medications.  *If you need a refill on your cardiac medications before your next appointment, please call your pharmacy*   Follow-Up: At [facility], you and your health needs are our priority.  As part of our continuing mission to provide you with exceptional heart care, we have created designated Provider Care Teams.  These Care Teams include your primary Cardiologist (physician) and Advanced Practice Providers (APPs -  Physician Assistants and Nurse Practitioners) who all work together to provide you with the care you need, when you need it.  We recommend signing up for the patient portal called "MyChart".  Sign up information is provided on this After Visit Summary.  MyChart is used to connect with patients for Virtual Visits (Telemedicine).  Patients are able to view lab/test results, encounter notes, upcoming appointments, etc.  Non-urgent messages can be sent to  your provider as well.   To learn more about what you can do with MyChart, go to [email or web address].    Your next appointment:   12 month(s)  The format for your next appointment:   In Person  Provider:   Eleonore Chiquito, MD  Time Spent with Patient: I have spent a total of 25 minutes with patient reviewing hospital notes, telemetry, EKGs, labs and examining the patient as well as establishing an assessment and plan that was discussed with the patient.  > 50% of time was spent in direct patient care.  Signed, Addison Naegeli. Audie Box, [place]  [address] [place], [place] [address] [phone]  05/05/2020 9:12 AM

## 2020-05-05 NOTE — Patient Instructions (Signed)

## 2020-05-06 ENCOUNTER — Telehealth: Payer: Self-pay

## 2020-05-06 NOTE — Telephone Encounter (Signed)
Submitted VOB, Gelsyn-3, right knee. 

## 2020-05-09 ENCOUNTER — Telehealth: Payer: Self-pay

## 2020-05-09 NOTE — Telephone Encounter (Signed)
VOB received... Pt's insurance requires prior auth  If approved pt will be covered @ 100% after $80 copay

## 2020-05-09 NOTE — Telephone Encounter (Signed)
Prior auth completed and faxed to [employer]. Waiting on approval or denial letter

## 2020-05-12 ENCOUNTER — Ambulatory Visit (INDEPENDENT_AMBULATORY_CARE_PROVIDER_SITE_OTHER): Payer: BC Managed Care – PPO | Admitting: Psychologist

## 2020-05-12 DIAGNOSIS — F411 Generalized anxiety disorder: Secondary | ICD-10-CM

## 2020-05-13 ENCOUNTER — Telehealth: Payer: Self-pay

## 2020-05-13 NOTE — Telephone Encounter (Signed)
Pt called and scheduled and stated understanding to copay

## 2020-05-13 NOTE — Telephone Encounter (Signed)
Approved for Gelsyn-3-Right knee Dr. Durward Fortes $80 copay Covered @ 100% after copay Prior auth required Auth # [id number] Dates: 05/09/20-11/08/20

## 2020-05-13 NOTE — Telephone Encounter (Signed)
Ok to schedule - next available.

## 2020-06-02 ENCOUNTER — Encounter: Payer: Self-pay | Admitting: Orthopaedic Surgery

## 2020-06-02 ENCOUNTER — Other Ambulatory Visit: Payer: Self-pay

## 2020-06-02 ENCOUNTER — Ambulatory Visit (INDEPENDENT_AMBULATORY_CARE_PROVIDER_SITE_OTHER): Payer: BC Managed Care – PPO | Admitting: Orthopaedic Surgery

## 2020-06-02 DIAGNOSIS — M1711 Unilateral primary osteoarthritis, right knee: Secondary | ICD-10-CM | POA: Diagnosis not present

## 2020-06-02 MED ORDER — LIDOCAINE HCL 1 % IJ SOLN
2.0000 mL | INTRAMUSCULAR | Status: AC | PRN
  Administered 2020-06-02: 2 mL

## 2020-06-02 NOTE — Progress Notes (Signed)
Office Visit Note   Patient: Dylan Webster           Date of Birth: 10/10/1975           MRN: [id number] Visit Date: 06/02/2020              Requested by: Jearld Fenton, NP [place],  [place] [address] PCP: Jearld Fenton, NP   Assessment & Plan: Visit Diagnoses:  1. Primary osteoarthritis of right knee     Plan:  #1: First Gelsyn injection was given to the right knee without difficulty.  Tolerated procedure well #2: Return back in 1 week for his second injection.  Follow-Up Instructions: No follow-ups on file.   Orders:  Orders Placed This Encounter  Procedures  . Large Joint Inj   No orders of the defined types were placed in this encounter.     Procedures: Large Joint Inj: R knee on 06/02/2020 5:11 PM Indications: pain and joint swelling Details: 25 G 1.5 in needle, anteromedial approach  Arthrogram: No  Medications: 2 mL lidocaine 1 % Outcome: tolerated well, no immediate complications  Gelsyn Procedure, treatment alternatives, risks and benefits explained, specific risks discussed. Consent was given by the patient. Immediately prior to procedure a time out was called to verify the correct patient, procedure, equipment, support staff and site/side marked as required. Patient was prepped and draped in the usual sterile fashion.       Clinical Data: No additional findings.   Subjective: Chief Complaint  Patient presents with  . Right Knee - Follow-up    Gelsyn series started 06/02/2020   HPI Patient presents today for the first Gelsyn injection in his right knee.   Review of Systems  Constitutional: Negative for fatigue.  HENT: Negative for ear pain.   Eyes: Negative for pain.  Respiratory: Negative for shortness of breath.   Cardiovascular: Negative for leg swelling.  Gastrointestinal: Negative for constipation and diarrhea.  Endocrine: Negative for cold intolerance and heat intolerance.  Genitourinary: Negative for  difficulty urinating.  Musculoskeletal: Negative for joint swelling.  Skin: Negative for rash.  Allergic/Immunologic: Negative for food allergies.  Neurological: Negative for weakness.  Hematological: Does not bruise/bleed easily.  Psychiatric/Behavioral: Negative for sleep disturbance.     Objective: Vital Signs: Ht 5\' 8"  (1.727 m)   Wt 221 lb (100.2 kg)   BMI 33.60 kg/m   Physical Exam Constitutional:      Appearance: Normal appearance. He is well-developed and normal weight.  HENT:     Head: Normocephalic.  Eyes:     Pupils: Pupils are equal, round, and reactive to light.  Pulmonary:     Effort: Pulmonary effort is normal.  Skin:    General: Skin is warm and dry.  Neurological:     Mental Status: He is alert and oriented to person, place, and time.  Psychiatric:        Behavior: Behavior normal.     Ortho Exam  Exam today reveals no erythema or ecchymosis. He has range of motion near full extension to about 100 degrees of flexion. Calf is supple nontender. Trace effusion. No warmth or erythema.   Specialty Comments:  No specialty comments available.  Imaging: No results found.   PMFS History: Current Outpatient Medications  Medication Sig Dispense Refill  . acetaminophen (TYLENOL) 325 MG tablet Take 650 mg by mouth every 6 (six) hours as needed for moderate pain.     Marland Kitchen amLODipine (NORVASC) 5 MG  tablet TAKE 1 TABLET BY MOUTH EVERY DAY 30 tablet 2  . busPIRone (BUSPAR) 5 MG tablet Take 1 tablet (5 mg total) by mouth 2 (two) times daily as needed. 45 tablet 0  . ibuprofen (ADVIL,MOTRIN) 200 MG tablet Take 800 mg by mouth daily as needed for moderate pain.     . meloxicam (MOBIC) 15 MG tablet Take 15 mg by mouth daily as needed for pain.      No current facility-administered medications for this visit.    Patient Active Problem List   Diagnosis Date Noted  . Primary osteoarthritis of right knee 06/02/2020  . Bilateral primary osteoarthritis of knee  04/26/2020  . Anxiety 02/18/2020  . OSA (obstructive sleep apnea) 04/01/2019  . Hypertension, essential 02/24/2019   Past Medical History:  Diagnosis Date  . Allergy   . HTN (hypertension)     Family History  Problem Relation Age of Onset  . Alcohol abuse Father   . Heart disease Father   . Alcohol abuse Mother   . Diabetes Mother   . Hyperlipidemia Maternal Aunt   . Heart disease Paternal Uncle   . Hypertension Paternal Uncle   . Alcohol abuse Paternal Grandfather   . Hyperlipidemia Paternal Grandfather   . Heart disease Paternal Grandfather   . Hypertension Paternal Grandfather   . Diabetes Paternal Grandfather   . Kidney disease Paternal Grandfather   . Leukemia Maternal Aunt     Past Surgical History:  Procedure Laterality Date  . NO PAST SURGERIES     Social History   Occupational History  . Occupation: maintenance  Tobacco Use  . Smoking status: Former Smoker    Packs/day: 1.00    Years: 12.00    Pack years: 12.00    Types: Cigarettes    Quit date: 12/22/2018    Years since quitting: 1.4  . Smokeless tobacco: Never Used  . Tobacco comment: quit 12/2018  Substance and Sexual Activity  . Alcohol use: No  . Drug use: No  . Sexual activity: Yes    Birth control/protection: None

## 2020-06-06 ENCOUNTER — Ambulatory Visit (INDEPENDENT_AMBULATORY_CARE_PROVIDER_SITE_OTHER): Payer: BC Managed Care – PPO | Admitting: Psychologist

## 2020-06-06 DIAGNOSIS — F411 Generalized anxiety disorder: Secondary | ICD-10-CM | POA: Diagnosis not present

## 2020-06-09 ENCOUNTER — Ambulatory Visit (INDEPENDENT_AMBULATORY_CARE_PROVIDER_SITE_OTHER): Payer: BC Managed Care – PPO | Admitting: Orthopaedic Surgery

## 2020-06-09 ENCOUNTER — Encounter: Payer: Self-pay | Admitting: Internal Medicine

## 2020-06-09 ENCOUNTER — Ambulatory Visit (INDEPENDENT_AMBULATORY_CARE_PROVIDER_SITE_OTHER): Payer: BC Managed Care – PPO | Admitting: Internal Medicine

## 2020-06-09 ENCOUNTER — Other Ambulatory Visit: Payer: Self-pay

## 2020-06-09 ENCOUNTER — Encounter: Payer: Self-pay | Admitting: Orthopaedic Surgery

## 2020-06-09 VITALS — Ht 68.0 in | Wt 221.0 lb

## 2020-06-09 VITALS — BP 138/86 | HR 70 | Temp 98.3°F | Ht 68.0 in | Wt 221.0 lb

## 2020-06-09 DIAGNOSIS — M1711 Unilateral primary osteoarthritis, right knee: Secondary | ICD-10-CM

## 2020-06-09 DIAGNOSIS — I10 Essential (primary) hypertension: Secondary | ICD-10-CM | POA: Diagnosis not present

## 2020-06-09 DIAGNOSIS — M170 Bilateral primary osteoarthritis of knee: Secondary | ICD-10-CM

## 2020-06-09 DIAGNOSIS — G4733 Obstructive sleep apnea (adult) (pediatric): Secondary | ICD-10-CM | POA: Diagnosis not present

## 2020-06-09 DIAGNOSIS — Z0000 Encounter for general adult medical examination without abnormal findings: Secondary | ICD-10-CM | POA: Diagnosis not present

## 2020-06-09 DIAGNOSIS — F419 Anxiety disorder, unspecified: Secondary | ICD-10-CM | POA: Diagnosis not present

## 2020-06-09 DIAGNOSIS — Z23 Encounter for immunization: Secondary | ICD-10-CM | POA: Diagnosis not present

## 2020-06-09 DIAGNOSIS — S61212A Laceration without foreign body of right middle finger without damage to nail, initial encounter: Secondary | ICD-10-CM

## 2020-06-09 MED ORDER — SODIUM HYALURONATE (VISCOSUP) 16.8 MG/2ML IX SOSY
16.8000 mg | PREFILLED_SYRINGE | INTRA_ARTICULAR | Status: AC | PRN
  Administered 2020-06-09: 16.8 mg via INTRA_ARTICULAR

## 2020-06-09 NOTE — Progress Notes (Signed)
Subjective:    Patient ID: Dylan Webster, male    DOB: 1976/09/14, 44 y.o.   MRN: [id number]  HPI  Patient presents the clinic today for his annual exam.  He is also due to follow-up chronic conditions.  HTN: His BP today is 138/86.  He is taking Amlodipine as prescribed.  ECG from 11/2019 reviewed.  Anxiety: Intermittent.  He is not currently taking any medication for this.  He is seeing a therapist.  He denies depression, SI/HI.  OA: Mainly in his knees.  He got his 2nd Gelsyn injection today Dr. Durward Fortes. He takes Ibuprofen as needed with good relief of symptoms.  He is not following with orthopedics.  OSA: He is not currently using a CPAP- reports it has been recalled. Sleep study from 03/2019 reviewed.  Flu: never Tetanus: ? 10 years ago Covid: [place] screening: as needed Dentist: as needed  Diet: He does eat meat. He consumes fruits and veggies. He tries to avoid fried foods. He drinks mostly flavored water. Exercise: Walking  Review of Systems      Past Medical History:  Diagnosis Date  . Allergy   . HTN (hypertension)     Current Outpatient Medications  Medication Sig Dispense Refill  . acetaminophen (TYLENOL) 325 MG tablet Take 650 mg by mouth every 6 (six) hours as needed for moderate pain.     Marland Kitchen amLODipine (NORVASC) 5 MG tablet TAKE 1 TABLET BY MOUTH EVERY DAY 30 tablet 2  . busPIRone (BUSPAR) 5 MG tablet Take 1 tablet (5 mg total) by mouth 2 (two) times daily as needed. 45 tablet 0  . ibuprofen (ADVIL,MOTRIN) 200 MG tablet Take 800 mg by mouth daily as needed for moderate pain.     . meloxicam (MOBIC) 15 MG tablet Take 15 mg by mouth daily as needed for pain.      No current facility-administered medications for this visit.    No Known Allergies  Family History  Problem Relation Age of Onset  . Alcohol abuse Father   . Heart disease Father   . Alcohol abuse Mother   . Diabetes Mother   . Hyperlipidemia Maternal Aunt   . Heart disease Paternal  Uncle   . Hypertension Paternal Uncle   . Alcohol abuse Paternal Grandfather   . Hyperlipidemia Paternal Grandfather   . Heart disease Paternal Grandfather   . Hypertension Paternal Grandfather   . Diabetes Paternal Grandfather   . Kidney disease Paternal Grandfather   . Leukemia Maternal Aunt        Constitutional: Denies fever, malaise, fatigue, headache or abrupt weight changes.  HEENT: Denies eye pain, eye redness, ear pain, ringing in the ears, wax buildup, runny nose, nasal congestion, bloody nose, or sore throat. Respiratory: Denies difficulty breathing, shortness of breath, cough or sputum production.   Cardiovascular: Denies chest pain, chest tightness, palpitations or swelling in the hands or feet.  Gastrointestinal: Denies abdominal pain, bloating, constipation, diarrhea or blood in the stool.  GU: Denies urgency, frequency, pain with urination, burning sensation, blood in urine, odor or discharge. Musculoskeletal: Pt reports intermittent knee pain. Denies decrease in range of motion, difficulty with gait, muscle pain or joint swelling.  Skin: Pt reports 2 day laceration of right middle finger. Denies redness, rashes, lesions or ulcercations.  Neurological: Denies dizziness, difficulty with memory, difficulty with speech or problems with balance and coordination.  Psych: Pt reports intermittent anxiety. Denies depression, SI/HI.  No other specific complaints in a complete review of  systems (except as listed in HPI above).  Objective:   Physical Exam  BP 138/86   Pulse 70   Temp 98.3 F (36.8 C) (Temporal)   Ht _0  (1.727 m)   Wt 221 lb (100.2 kg)   SpO2 97%   BMI 33.60 kg/m   Wt Readings from Last 3 Encounters:  06/02/20 221 lb (100.2 kg)  05/05/20 (!) 221 lb 3.2 oz (100.3 kg)  04/27/20 222 lb (100.7 kg)    General: Appears his stated age, obese, in NAD. Skin: Warm, dry and intact.1 cm laceration noted of tip of right middle finger. No redness or drainage  noted. HEENT: Head: normal shape and size; Eyes: sclera white, no icterus, conjunctiva pink, PERRLA and EOMs intact;  Neck:  Neck supple, trachea midline. No masses, lumps or thyromegaly present.  Cardiovascular: Normal rate and rhythm. S1,S2 noted.  No murmur, rubs or gallops noted. No JVD or BLE edema.  Pulmonary/Chest: Normal effort and positive vesicular breath sounds. No respiratory distress. No wheezes, rales or ronchi noted.  Abdomen: Soft and nontender. Normal bowel sounds. No distention or masses noted. Liver, spleen and kidneys non palpable. Musculoskeletal: Strength 5/5 BUE/BLE. No difficulty with gait.  Neurological: Alert and oriented. Cranial nerves II-XII grossly intact. Coordination normal.  Psychiatric: Mood and affect normal. Behavior is normal. Judgment and thought content normal.    BMET    Component Value Date/Time   NA 141 12/30/2019 1423   K 4.1 12/30/2019 1423   CL 105 12/30/2019 1423   CO2 22 12/30/2019 1423   GLUCOSE 127 (H) 12/30/2019 1423   GLUCOSE 89 11/18/2019 1446   BUN 16 12/30/2019 1423   CREATININE 0.77 12/30/2019 1423   CALCIUM 9.6 12/30/2019 1423   GFRNONAA 111 12/30/2019 1423   GFRAA 128 12/30/2019 1423    Lipid Panel     Component Value Date/Time   CHOL 183 02/05/2019 0809   TRIG 72.0 02/05/2019 0809   HDL 43.20 02/05/2019 0809   CHOLHDL 4 02/05/2019 0809   VLDL 14.4 02/05/2019 0809   LDLCALC 126 (H) 02/05/2019 0809    CBC    Component Value Date/Time   WBC 8.1 11/18/2019 1446   RBC 4.50 11/18/2019 1446   HGB 14.0 11/18/2019 1446   HCT 40.2 11/18/2019 1446   PLT 273 11/18/2019 1446   MCV 89.3 11/18/2019 1446   MCH 31.1 11/18/2019 1446   MCHC 34.8 11/18/2019 1446   RDW 11.8 11/18/2019 1446   LYMPHSABS 3.4 05/24/2011 0613   MONOABS 0.8 05/24/2011 0613   EOSABS 0.2 05/24/2011 0613   BASOSABS 0.0 05/24/2011 0613    Hgb A1C Lab Results  Component Value Date   HGBA1C 4.9 02/05/2019           Assessment & Plan:    Preventative Health Maintenance:  Flu shot today Tdap today Covid UTD Encouraged him to consume a balanced diet and exercise regimen Advised him to see an eye doctor and dentist annually We will check CBC, C met, lipid, A1c today  Laceration of Right Middle Finger:  No infection Too late for sutures Wound care provided  RTC in 1 year, sooner if needed Webb Silversmith, NP This visit occurred during the SARS-CoV-2 public health emergency.  Safety protocols were in place, including screening questions prior to the visit, additional usage of staff PPE, and extensive cleaning of exam room while observing appropriate contact time as indicated for disinfecting solutions.

## 2020-06-09 NOTE — Assessment & Plan Note (Signed)
Currently CPAP machine recalled Advised him to follow up with pulmonology regarding this.

## 2020-06-09 NOTE — Patient Instructions (Signed)

## 2020-06-09 NOTE — Assessment & Plan Note (Addendum)
Continue Amlodipine CMET today Reinforced DASH diet and exercise for weight loss

## 2020-06-09 NOTE — Progress Notes (Signed)
Office Visit Note   Patient: Dylan Webster           Date of Birth: 1976-08-21           MRN: [id number] Visit Date: 06/09/2020              Requested by: Jearld Fenton, NP [place],  [place] [address] PCP: Jearld Fenton, NP   Assessment & Plan: Visit Diagnoses:  1. Primary osteoarthritis of right knee     Plan: Second GELSYN-3 injection right knee. notes he had good relief after the first.  Follow-Up Instructions: Return in about 1 week (around 06/16/2020).   Orders:  Orders Placed This Encounter  Procedures  . Large Joint Inj: R knee   No orders of the defined types were placed in this encounter.     Procedures: Large Joint Inj: R knee on 06/09/2020 2:14 PM Indications: pain and joint swelling Details: 25 G 1.5 in needle  Arthrogram: No  Medications: 16.8 mg Sodium Hyaluronate (Viscosup) 16.8 MG/2ML Outcome: tolerated well, no immediate complications Procedure, treatment alternatives, risks and benefits explained, specific risks discussed. Consent was given by the patient. Immediately prior to procedure a time out was called to verify the correct patient, procedure, equipment, support staff and site/side marked as required. Patient was prepped and draped in the usual sterile fashion.       Clinical Data: No additional findings.   Subjective: Chief Complaint  Patient presents with  . Right Knee - Follow-up    Gelsyn started 06/02/2020  Patient presents today for the second Gelsyn injection in her right knee. He started the series on 06/02/2020. He states that he has already noticed improvement and able to go down stairs, which he could not do before.  HPI  Review of Systems   Objective: Vital Signs: Ht 5\' 8"  (1.727 m)   Wt 221 lb (100.2 kg)   BMI 33.60 kg/m   Physical Exam  Ortho Exam right knee was not hot red warm or swollen.  Full extension flexed over 100 degrees without instability  Specialty Comments:  No specialty  comments available.  Imaging: No results found.   PMFS History: Patient Active Problem List   Diagnosis Date Noted  . Primary osteoarthritis of right knee 06/02/2020  . Bilateral primary osteoarthritis of knee 04/26/2020  . Anxiety 02/18/2020  . OSA (obstructive sleep apnea) 04/01/2019  . Hypertension, essential 02/24/2019   Past Medical History:  Diagnosis Date  . Allergy   . HTN (hypertension)     Family History  Problem Relation Age of Onset  . Alcohol abuse Father   . Heart disease Father   . Alcohol abuse Mother   . Diabetes Mother   . Hyperlipidemia Maternal Aunt   . Heart disease Paternal Uncle   . Hypertension Paternal Uncle   . Alcohol abuse Paternal Grandfather   . Hyperlipidemia Paternal Grandfather   . Heart disease Paternal Grandfather   . Hypertension Paternal Grandfather   . Diabetes Paternal Grandfather   . Kidney disease Paternal Grandfather   . Leukemia Maternal Aunt     Past Surgical History:  Procedure Laterality Date  . NO PAST SURGERIES     Social History   Occupational History  . Occupation: maintenance  Tobacco Use  . Smoking status: Former Smoker    Packs/day: 1.00    Years: 12.00    Pack years: 12.00    Types: Cigarettes    Quit date: 12/22/2018  Years since quitting: 1.4  . Smokeless tobacco: Never Used  . Tobacco comment: quit 12/2018  Substance and Sexual Activity  . Alcohol use: No  . Drug use: No  . Sexual activity: Yes    Birth control/protection: None

## 2020-06-10 LAB — CBC
HCT: 39 % (ref 39.0–52.0)
Hemoglobin: 13.5 g/dL (ref 13.0–17.0)
MCHC: 34.6 g/dL (ref 30.0–36.0)
MCV: 92.3 fl (ref 78.0–100.0)
Platelets: 267 10*3/uL (ref 150.0–400.0)
RBC: 4.22 Mil/uL (ref 4.22–5.81)
RDW: 11.9 % (ref 11.5–15.5)
WBC: 8.2 10*3/uL (ref 4.0–10.5)

## 2020-06-10 LAB — COMPREHENSIVE METABOLIC PANEL
ALT: 19 U/L (ref 0–53)
AST: 17 U/L (ref 0–37)
Albumin: 4.6 g/dL (ref 3.5–5.2)
Alkaline Phosphatase: 83 U/L (ref 39–117)
BUN: 23 mg/dL (ref 6–23)
CO2: 26 mEq/L (ref 19–32)
Calcium: 9.3 mg/dL (ref 8.4–10.5)
Chloride: 105 mEq/L (ref 96–112)
Creatinine, Ser: 1.15 mg/dL (ref 0.40–1.50)
GFR: 83.72 mL/min (ref >60.00)
Glucose, Bld: 82 mg/dL (ref 70–99)
Potassium: 4.1 mEq/L (ref 3.5–5.1)
Sodium: 140 mEq/L (ref 135–145)
Total Bilirubin: 1 mg/dL (ref 0.2–1.2)
Total Protein: 7.1 g/dL (ref 6.0–8.3)

## 2020-06-10 LAB — LIPID PANEL
Cholesterol: 166 mg/dL (ref 0–200)
HDL: 47.7 mg/dL (ref >39.00)
LDL Cholesterol: 99 mg/dL (ref 0–99)
NonHDL: 118.41
Total CHOL/HDL Ratio: 3
Triglycerides: 97 mg/dL (ref 0.0–149.0)
VLDL: 19.4 mg/dL (ref 0.0–40.0)

## 2020-06-10 LAB — HEMOGLOBIN A1C: Hgb A1c MFr Bld: 4.8 % (ref 4.6–6.5)

## 2020-06-10 NOTE — Addendum Note (Signed)
Addended by: Lindalou Hose Y on: 06/10/2020 06:00 PM   Modules accepted: Orders

## 2020-06-15 ENCOUNTER — Telehealth: Payer: Self-pay | Admitting: Internal Medicine

## 2020-06-15 DIAGNOSIS — G4733 Obstructive sleep apnea (adult) (pediatric): Secondary | ICD-10-CM

## 2020-06-16 NOTE — Telephone Encounter (Signed)
We can try ordering a replacement from his DME for his recalled CPAP machine, same settings  He can go online to [email or web address].com or similar and see what it would cost to buy a replacement machine like an AutoSense 10 by [employer]  if he were to pay out of pocket.

## 2020-06-16 NOTE — Telephone Encounter (Signed)
Called and spoke with pt's wife Caryl Pina who stated that pt went online and it showed that pt's cpap machine was being recalled.  Asked if pt placed serial number in to see what it said about getting a replacement machine and she said he did. Pt received email but nothing was stated in regards to when/if pt could receive new machine from [employer] website.  Dr. Annamaria Boots, please advise recommendations.

## 2020-06-16 NOTE — Telephone Encounter (Signed)
Eustace Pen and she did not answer- LMTCB

## 2020-06-16 NOTE — Telephone Encounter (Signed)
Spoke with Caryl Pina and notified of response per Dr Annamaria Boots  She verbalized understanding  Order sent to [facility] for this  Nothing further needed

## 2020-06-16 NOTE — Telephone Encounter (Signed)
Attempted to call Dylan Webster but unable to reach. Left message for her to return call.

## 2020-06-16 NOTE — Telephone Encounter (Signed)
Caryl Pina returning a phone call. Caryl Pina can be reached at [phone].

## 2020-06-21 ENCOUNTER — Other Ambulatory Visit: Payer: Self-pay

## 2020-06-21 ENCOUNTER — Encounter: Payer: Self-pay | Admitting: Orthopaedic Surgery

## 2020-06-21 ENCOUNTER — Ambulatory Visit (INDEPENDENT_AMBULATORY_CARE_PROVIDER_SITE_OTHER): Payer: BC Managed Care – PPO | Admitting: Orthopaedic Surgery

## 2020-06-21 VITALS — Ht 68.0 in | Wt 221.0 lb

## 2020-06-21 DIAGNOSIS — M1711 Unilateral primary osteoarthritis, right knee: Secondary | ICD-10-CM

## 2020-06-21 DIAGNOSIS — M170 Bilateral primary osteoarthritis of knee: Secondary | ICD-10-CM

## 2020-06-21 MED ORDER — LIDOCAINE HCL 1 % IJ SOLN
2.0000 mL | INTRAMUSCULAR | Status: AC | PRN
  Administered 2020-06-21: 2 mL

## 2020-06-21 NOTE — Progress Notes (Signed)
Office Visit Note   Patient: Dylan Webster           Date of Birth: 1976-05-20           MRN: [id number] Visit Date: 06/21/2020              Requested by: Jearld Fenton, NP [address] [place],  [place] [address] PCP: Jearld Fenton, NP   Assessment & Plan: Visit Diagnoses:  1. Bilateral primary osteoarthritis of knee     Plan:  #1: Third Lanny Hurst was given to the right knee.  Tolerated well. #2: Continue use of his Voltaren gel which apparently is very beneficial for him #3: Follow back up as needed  Follow-Up Instructions: No follow-ups on file.   Orders:  No orders of the defined types were placed in this encounter.  No orders of the defined types were placed in this encounter.     Procedures: Large Joint Inj: R knee on 06/21/2020 1:15 PM Indications: pain and joint swelling Details: 25 G 1.5 in needle, anteromedial approach  Arthrogram: No  Medications: 2 mL lidocaine 1 % Outcome: tolerated well, no immediate complications  GELSYN Procedure, treatment alternatives, risks and benefits explained, specific risks discussed. Consent was given by the patient. Immediately prior to procedure a time out was called to verify the correct patient, procedure, equipment, support staff and site/side marked as required. Patient was prepped and draped in the usual sterile fashion.       Clinical Data: No additional findings.   Subjective: Chief Complaint  Patient presents with  . Right Knee - Follow-up    Gelsyn started 06/02/2020  Patient presents today for the third Gelsyn injection in his right knee. He started the series on 06/02/2020. Patient states that he is doing well and has already noticed improvement. He wishes to continue with the last injection.  HPI  Review of Systems  Constitutional: Negative for fatigue.  HENT: Negative for ear pain.   Eyes: Negative for pain.  Respiratory: Negative for shortness of breath.   Cardiovascular: Negative for leg  swelling.  Gastrointestinal: Negative for constipation and diarrhea.  Endocrine: Negative for cold intolerance and heat intolerance.  Genitourinary: Negative for difficulty urinating.  Musculoskeletal: Negative for joint swelling.  Skin: Negative for rash.  Allergic/Immunologic: Negative for food allergies.  Neurological: Negative for weakness.  Hematological: Does not bruise/bleed easily.  Psychiatric/Behavioral: Negative for sleep disturbance.     Objective: Vital Signs: Ht 5\' 8"  (1.727 m)   Wt 221 lb (100.2 kg)   BMI 33.60 kg/m   Physical Exam  Ortho Exam  Exam today reveals mild effusion without warmth or erythema. States improved pain.  Specialty Comments:  No specialty comments available.  Imaging: No results found.   PMFS History: Patient Active Problem List   Diagnosis Date Noted  . Bilateral primary osteoarthritis of knee 04/26/2020  . Anxiety 02/18/2020  . OSA (obstructive sleep apnea) 04/01/2019  . Hypertension, essential 02/24/2019   Past Medical History:  Diagnosis Date  . Allergy   . HTN (hypertension)     Family History  Problem Relation Age of Onset  . Alcohol abuse Father   . Heart disease Father   . Alcohol abuse Mother   . Diabetes Mother   . Hyperlipidemia Maternal Aunt   . Heart disease Paternal Uncle   . Hypertension Paternal Uncle   . Alcohol abuse Paternal Grandfather   . Hyperlipidemia Paternal Grandfather   . Heart disease Paternal Grandfather   .  Hypertension Paternal Grandfather   . Diabetes Paternal Grandfather   . Kidney disease Paternal Grandfather   . Leukemia Maternal Aunt     Past Surgical History:  Procedure Laterality Date  . NO PAST SURGERIES     Social History   Occupational History  . Occupation: maintenance  Tobacco Use  . Smoking status: Former Smoker    Packs/day: 1.00    Years: 12.00    Pack years: 12.00    Types: Cigarettes    Quit date: 12/22/2018    Years since quitting: 1.4  . Smokeless  tobacco: Never Used  . Tobacco comment: quit 12/2018  Substance and Sexual Activity  . Alcohol use: No  . Drug use: No  . Sexual activity: Yes    Birth control/protection: None

## 2020-06-23 ENCOUNTER — Telehealth: Payer: Self-pay | Admitting: Internal Medicine

## 2020-06-23 DIAGNOSIS — G4733 Obstructive sleep apnea (adult) (pediatric): Secondary | ICD-10-CM

## 2020-06-23 NOTE — Telephone Encounter (Signed)
Spoke with patent regarding prior message.calling to get the order for the cpap [facility] [place] because [facility] said that will take 90 days before the pt would receive a machine. Advised patent I will send a order in for the [facility] send in referral. Patient's voice was understanding . Nothing else further needed.

## 2020-07-19 ENCOUNTER — Other Ambulatory Visit: Payer: Self-pay | Admitting: Internal Medicine

## 2020-10-25 ENCOUNTER — Telehealth: Payer: Self-pay | Admitting: Internal Medicine

## 2020-10-25 DIAGNOSIS — G4733 Obstructive sleep apnea (adult) (pediatric): Secondary | ICD-10-CM

## 2020-10-25 NOTE — Telephone Encounter (Signed)
Nicole Kindred from [facility] checking on fax for CPAP machine. Fax sent 10/21/2020. Nicole Kindred phone number is [phone].

## 2020-10-25 NOTE — Telephone Encounter (Signed)
Referral placed as requested.  Nothing further needed at this time- will close encounter.

## 2020-10-25 NOTE — Telephone Encounter (Signed)
Ok to use info from 06/16/20, redirecting order from [facility] to [facility] CPAP auto 10-18, mask of choice, humidifier, supplies, Airview/ card    dx OSA

## 2020-10-25 NOTE — Telephone Encounter (Signed)
Called and left a message for Nicole Kindred from [facility].  Called and spoke with patient.  He states the original order for CPAP was sent to [facility], but no one ever called him, this was back from September.  Patient registered his recalled [employer] CPAP machine, but just keeps receiving messages to register it.  Patient just wants to get a new CPAP machine.  Patient is requesting an order for the CPAP machine be sent to [facility].  Dr. Annamaria Boots, Please advise if ok to sent CPAP script to [facility].  Please advise on settings for CPAP machine.  Thank you.

## 2020-11-02 ENCOUNTER — Telehealth: Payer: Self-pay | Admitting: Internal Medicine

## 2020-11-02 DIAGNOSIS — G4733 Obstructive sleep apnea (adult) (pediatric): Secondary | ICD-10-CM

## 2020-11-02 NOTE — Telephone Encounter (Signed)
Order has been placed for the DME order to go from [facility] to [facility] for the cpap order.

## 2021-01-23 NOTE — Progress Notes (Signed)
HPI M former smoker followed for OSA, complicated by HBP, Hypercholesterolemia,  NPSG 04/01/2019- AHI 93.3/ hr, desaturation to 75%, body weight 244 lbs  -------------------------------------------------------------------------------------------   01/25/20- 44 yoM former smoker followed for OSA, complicated by HBP, Hypercholesterolemia,  NPSG 04/01/2019- AHI 93.3/ hr, desaturation to 75%, body weight 244 lbs CPAP auto 10-18/ [facility] Download- Body weight today 231 lbs Occasionally on-call for work with CPAP unavailable those nights. Had good cardiac CT w/u for chest pain.  01/24/21- 62 yoM former smoker followed for OSA, complicated by HBP, Hypercholesterolemia,  CPAP auto 10-18/ [facility] [phone]) Download- pending for current machine Body weight today-230 lbs Covid vax-3 Moderna Flu vax-had  Requested DME change to [facility] on 11/02/20 phone call -----Patient is doing good overall, no concerns. Recently got new machine and is getting used to it.  [employer] machine was recalled. Now has [employer] through a [employer] although he lives in [place], moving to [place].  Would like higher minimum pressure-discussed. Denies other significant concerns- mentions sore knees.   ROS-see HPI   + = positive Constitutional:    weight loss, night sweats, fevers, chills,+ fatigue, lassitude. HEENT:    headaches, difficulty swallowing, tooth/dental problems, sore throat,       sneezing, itching, ear ache, nasal congestion, post nasal drip, snoring CV:    chest pain, orthopnea, PND, swelling in lower extremities, anasarca,                                   dizziness, palpitations Resp:   shortness of breath with exertion or at rest.                productive cough,   non-productive cough, coughing up of blood.              change in color of mucus.  wheezing.   Skin:    rash or lesions. GI:  No-   heartburn, indigestion, abdominal pain, nausea, vomiting, diarrhea,                  change in bowel habits, loss of appetite GU: dysuria, change in color of urine, no urgency or frequency.   flank pain. MS:   joint pain, stiffness, decreased range of motion, back pain. Neuro-     nothing unusual Psych:  change in mood or affect.  depression or anxiety.   memory loss.  OBJ- Physical Exam General- Alert, Oriented, Affect-appropriate, Distress- none acute, + obese Skin- rash-none, lesions- none, excoriation- none Lymphadenopathy- none Head- atraumatic            Eyes- Gross vision intact, PERRLA, conjunctivae and secretions clear            Ears- Hearing, canals-normal            Nose- Clear, no-Septal dev, mucus, polyps, erosion, perforation             Throat- Mallampati IV, mucosa clear , drainage- none, tonsils- atrophic, + teeth Neck- flexible , trachea midline, no stridor , thyroid nl, carotid no bruit Chest - symmetrical excursion , unlabored           Heart/CV- RRR , no murmur , no gallop  , no rub, nl s1 s2                           - JVD- none , edema- none,  stasis changes- none, varices- none           Lung- clear to P&A, wheeze- none, cough- none , dullness-none, rub- none           Chest wall-  Abd-  Br/ Gen/ Rectal- Not done, not indicated Extrem- cyanosis- none, clubbing, none, atrophy- none, strength- nl Neuro- grossly intact to observation

## 2021-01-24 ENCOUNTER — Other Ambulatory Visit: Payer: Self-pay

## 2021-01-24 ENCOUNTER — Ambulatory Visit (INDEPENDENT_AMBULATORY_CARE_PROVIDER_SITE_OTHER): Payer: BC Managed Care – PPO | Admitting: Internal Medicine

## 2021-01-24 ENCOUNTER — Encounter: Payer: Self-pay | Admitting: Internal Medicine

## 2021-01-24 DIAGNOSIS — G4733 Obstructive sleep apnea (adult) (pediatric): Secondary | ICD-10-CM | POA: Diagnosis not present

## 2021-01-24 NOTE — Patient Instructions (Signed)
We will get the download report on your current machine and adjust settings to have it start a little higher pressure.  Order- refer for mask fitting at sleep center  Please call if we can help

## 2021-01-24 NOTE — Assessment & Plan Note (Signed)
Benefits from CPAP. Would like higher starting pressure, but we will wait until download available. Have requested from [facility].

## 2021-03-08 ENCOUNTER — Encounter: Payer: Self-pay | Admitting: Orthopaedic Surgery

## 2021-03-09 MED ORDER — DICLOFENAC SODIUM 75 MG PO TBEC: DELAYED_RELEASE_TABLET | ORAL | 0 refills | Status: DC

## 2021-04-07 ENCOUNTER — Emergency Department (HOSPITAL_BASED_OUTPATIENT_CLINIC_OR_DEPARTMENT_OTHER): Payer: BC Managed Care – PPO

## 2021-04-07 ENCOUNTER — Encounter (HOSPITAL_BASED_OUTPATIENT_CLINIC_OR_DEPARTMENT_OTHER): Payer: Self-pay | Admitting: Emergency Medicine

## 2021-04-07 ENCOUNTER — Encounter: Payer: Self-pay | Admitting: Emergency Medicine

## 2021-04-07 ENCOUNTER — Other Ambulatory Visit: Payer: Self-pay

## 2021-04-07 ENCOUNTER — Emergency Department (HOSPITAL_BASED_OUTPATIENT_CLINIC_OR_DEPARTMENT_OTHER)
Admission: EM | Admit: 2021-04-07 | Discharge: 2021-04-07 | Disposition: A | Discharge status: Home / Self Care | Payer: BC Managed Care – PPO | Attending: Emergency Medicine | Admitting: Emergency Medicine

## 2021-04-07 DIAGNOSIS — I10 Essential (primary) hypertension: Secondary | ICD-10-CM | POA: Diagnosis not present

## 2021-04-07 DIAGNOSIS — Z87891 Personal history of nicotine dependence: Secondary | ICD-10-CM | POA: Insufficient documentation

## 2021-04-07 DIAGNOSIS — H538 Other visual disturbances: Secondary | ICD-10-CM | POA: Diagnosis not present

## 2021-04-07 DIAGNOSIS — R519 Headache, unspecified: Secondary | ICD-10-CM | POA: Diagnosis not present

## 2021-04-07 DIAGNOSIS — Z79899 Other long term (current) drug therapy: Secondary | ICD-10-CM | POA: Diagnosis not present

## 2021-04-07 HISTORY — DX: Obstructive sleep apnea (adult) (pediatric): G47.33

## 2021-04-07 HISTORY — DX: Bilateral primary osteoarthritis of knee: M17.0

## 2021-04-07 LAB — CBC WITH DIFFERENTIAL/PLATELET
Abs Immature Granulocytes: 0.01 10*3/uL (ref 0.00–0.07)
Basophils Absolute: 0 10*3/uL (ref 0.0–0.1)
Basophils Relative: 0 %
Eosinophils Absolute: 0.3 10*3/uL (ref 0.0–0.5)
Eosinophils Relative: 3 %
HCT: 40.4 % (ref 39.0–52.0)
Hemoglobin: 14.2 g/dL (ref 13.0–17.0)
Immature Granulocytes: 0 %
Lymphocytes Relative: 38 %
Lymphs Abs: 3.8 10*3/uL (ref 0.7–4.0)
MCH: 32.6 pg (ref 26.0–34.0)
MCHC: 35.1 g/dL (ref 30.0–36.0)
MCV: 92.9 fL (ref 80.0–100.0)
Monocytes Absolute: 0.7 10*3/uL (ref 0.1–1.0)
Monocytes Relative: 7 %
Neutro Abs: 5.1 10*3/uL (ref 1.7–7.7)
Neutrophils Relative %: 52 %
Platelets: 294 10*3/uL (ref 150–400)
RBC: 4.35 MIL/uL (ref 4.22–5.81)
RDW: 12.3 % (ref 11.5–15.5)
WBC: 9.9 10*3/uL (ref 4.0–10.5)
nRBC: 0 % (ref 0.0–0.2)

## 2021-04-07 LAB — BASIC METABOLIC PANEL
Anion gap: 9 (ref 5–15)
BUN: 14 mg/dL (ref 6–20)
CO2: 25 mmol/L (ref 22–32)
Calcium: 8.9 mg/dL (ref 8.9–10.3)
Chloride: 103 mmol/L (ref 98–111)
Creatinine, Ser: 0.84 mg/dL (ref 0.61–1.24)
GFR, Estimated: >60 mL/min (ref >60)
Glucose, Bld: 102 mg/dL — ABNORMAL HIGH (ref 70–99)
Potassium: 3.7 mmol/L (ref 3.5–5.1)
Sodium: 137 mmol/L (ref 135–145)

## 2021-04-07 MED ORDER — SODIUM CHLORIDE 0.9 % IV BOLUS
1000.0000 mL | Freq: Once | INTRAVENOUS | Status: AC
  Administered 2021-04-07: 1000 mL via INTRAVENOUS

## 2021-04-07 MED ORDER — METOCLOPRAMIDE HCL 5 MG/ML IJ SOLN
10.0000 mg | Freq: Once | INTRAMUSCULAR | Status: AC
  Administered 2021-04-07: 10 mg via INTRAVENOUS
  Filled 2021-04-07: qty 2

## 2021-04-07 MED ORDER — DIPHENHYDRAMINE HCL 50 MG/ML IJ SOLN
25.0000 mg | Freq: Once | INTRAMUSCULAR | Status: AC
  Administered 2021-04-07: 25 mg via INTRAVENOUS
  Filled 2021-04-07: qty 1

## 2021-04-07 MED ORDER — KETOROLAC TROMETHAMINE 30 MG/ML IJ SOLN
30.0000 mg | Freq: Once | INTRAMUSCULAR | Status: AC
  Administered 2021-04-07: 30 mg via INTRAVENOUS
  Filled 2021-04-07: qty 1

## 2021-04-07 MED ORDER — DEXAMETHASONE SODIUM PHOSPHATE 10 MG/ML IJ SOLN
10.0000 mg | Freq: Once | INTRAMUSCULAR | Status: AC
  Administered 2021-04-07: 10 mg via INTRAVENOUS
  Filled 2021-04-07: qty 1

## 2021-04-07 NOTE — ED Triage Notes (Signed)
Pt states symptoms initially started yesterday afternoon with a headache. Pt now has tingling from shoulders up and blurred vision with nausea. Headache has not subsided.

## 2021-04-07 NOTE — ED Provider Notes (Signed)
[facility]PART[facility] Provider Note   CSN: [id number] Arrival date & time: 04/07/21  0112     History Chief Complaint  Patient presents with   Headache    Dylan Webster is a 45 y.o. male.  Patient is a 45 year old male with history of hypertension and obstructive sleep apnea.  Patient presenting today for evaluation of headache.  He states this began at about noon today, then worsened as the day went on.  This evening as he was laying down to sleep and putting on his CPAP, he developed numbness to his head and neck, blurry vision, and dizziness.  He denies any injury or trauma.  He denies history of migraines.  The history is provided by the patient.  Headache Pain location:  Generalized Quality:  Dull Radiates to:  Does not radiate Duration:  12 hours Timing:  Constant Progression:  Worsening Chronicity:  New Relieved by:  Nothing Worsened by:  Light and activity Ineffective treatments:  None tried     Past Medical History:  Diagnosis Date   Allergy    HTN (hypertension)    OSA (obstructive sleep apnea)    Osteoarthritis of knees, bilateral     Patient Active Problem List   Diagnosis Date Noted   Osteoarthritis of knees, bilateral 04/26/2020   Anxiety 02/18/2020   OSA (obstructive sleep apnea) 04/01/2019   Hypertension, essential 02/24/2019    Past Surgical History:  Procedure Laterality Date   NO PAST SURGERIES         Family History  Problem Relation Age of Onset   Alcohol abuse Father    Heart disease Father    Alcohol abuse Mother    Diabetes Mother    Hyperlipidemia Maternal Aunt    Heart disease Paternal Uncle    Hypertension Paternal Uncle    Alcohol abuse Paternal Grandfather    Hyperlipidemia Paternal Grandfather    Heart disease Paternal Grandfather    Hypertension Paternal Grandfather    Diabetes Paternal Grandfather    Kidney disease Paternal Grandfather    Leukemia Maternal Aunt     Social History   Tobacco  Use   Smoking status: Former    Packs/day: 1.00    Years: 12.00    Pack years: 12.00    Types: Cigarettes    Quit date: 12/22/2018    Years since quitting: 2.2   Smokeless tobacco: Never   Tobacco comments:    quit 12/2018  Vaping Use   Vaping Use: Never used  Substance Use Topics   Alcohol use: No   Drug use: No    Home Medications Prior to Admission medications   Medication Sig Start Date End Date Taking? Authorizing Provider  amLODipine (NORVASC) 5 MG tablet TAKE 1 TABLET BY MOUTH EVERY DAY 07/21/20  Yes Baity, Coralie Keens, NP  diclofenac (VOLTAREN) 75 MG EC tablet Take one tablet two times daily with meals. 03/09/21  Yes Marybelle Killings, MD    Allergies    Patient has no known allergies.  Review of Systems   Review of Systems  Neurological:  Positive for headaches.  All other systems reviewed and are negative.  Physical Exam Updated Vital Signs BP (!) 151/91   Pulse 72   Temp 98.3 F (36.8 C) (Oral)   Resp 16   Ht 5\' 8"  (1.727 m)   Wt 103 kg   SpO2 99%   BMI 34.52 kg/m   Physical Exam Vitals and nursing note reviewed.  Constitutional:  General: He is not in acute distress.    Appearance: He is well-developed. He is not diaphoretic.  HENT:     Head: Normocephalic and atraumatic.  Cardiovascular:     Rate and Rhythm: Normal rate and regular rhythm.     Heart sounds: No murmur heard.   No friction rub.  Pulmonary:     Effort: Pulmonary effort is normal. No respiratory distress.     Breath sounds: Normal breath sounds. No wheezing or rales.  Abdominal:     General: Bowel sounds are normal. There is no distension.     Palpations: Abdomen is soft.     Tenderness: There is no abdominal tenderness.  Musculoskeletal:        General: Normal range of motion.     Cervical back: Normal range of motion and neck supple.  Skin:    General: Skin is warm and dry.  Neurological:     Mental Status: He is alert and oriented to person, place, and time.     Cranial  Nerves: No cranial nerve deficit or facial asymmetry.     Motor: No weakness.     Coordination: Coordination normal.     Gait: Gait normal.    ED Results / Procedures / Treatments   Labs (all labs ordered are listed, but only abnormal results are displayed) Labs Reviewed  CBC WITH DIFFERENTIAL/PLATELET  BASIC METABOLIC PANEL    EKG None  Radiology No results found.  Procedures Procedures   Medications Ordered in ED Medications  sodium chloride 0.9 % bolus 1,000 mL (has no administration in time range)  ketorolac (TORADOL) 30 MG/ML injection 30 mg (has no administration in time range)  metoCLOPramide (REGLAN) injection 10 mg (has no administration in time range)  diphenhydrAMINE (BENADRYL) injection 25 mg (has no administration in time range)  dexamethasone (DECADRON) injection 10 mg (has no administration in time range)    ED Course  I have reviewed the triage vital signs and the nursing notes.  Pertinent labs & imaging results that were available during my care of the patient were reviewed by me and considered in my medical decision making (see chart for details).    MDM Rules/Calculators/A&P  Patient presenting with complaints of headache and blurry vision as described in the HPI.  Symptoms sound migrainous in nature.  His headache improved with a migraine cocktail.  His neurologic exam and head CT are unremarkable.  He still describes some blurry vision, but states that this has improved as well.  Laboratory studies unremarkable.  At this point, discharge seems appropriate.  Patient to be discharged with follow-up with ophthalmology if vision is not improving.  Final Clinical Impression(s) / ED Diagnoses Final diagnoses:  None    Rx / DC Orders ED Discharge Orders     None        Veryl Speak, MD 04/07/21 [phone]

## 2021-04-07 NOTE — ED Notes (Signed)
Attempted IV in left AC unsuccessful

## 2021-04-07 NOTE — Discharge Instructions (Addendum)
Follow-up with ophthalmology if your vision is not improving.  The contact information for Dr. Manuella Ghazi has been provided in this discharge summary for you to call and make these arrangements.

## 2021-05-12 ENCOUNTER — Other Ambulatory Visit: Payer: Self-pay | Admitting: Orthopaedic Surgery

## 2021-05-12 NOTE — Telephone Encounter (Signed)
Please advise 

## 2021-06-22 IMAGING — CR DG CHEST 2V
2 series · 2 of 2 positions shown · non-contrast
Comparison: 07/30/2019.

CLINICAL DATA: Chest pain radiating to the right arm, diaphoresis.

EXAM:
CHEST - 2 VIEW

[w chest pa]
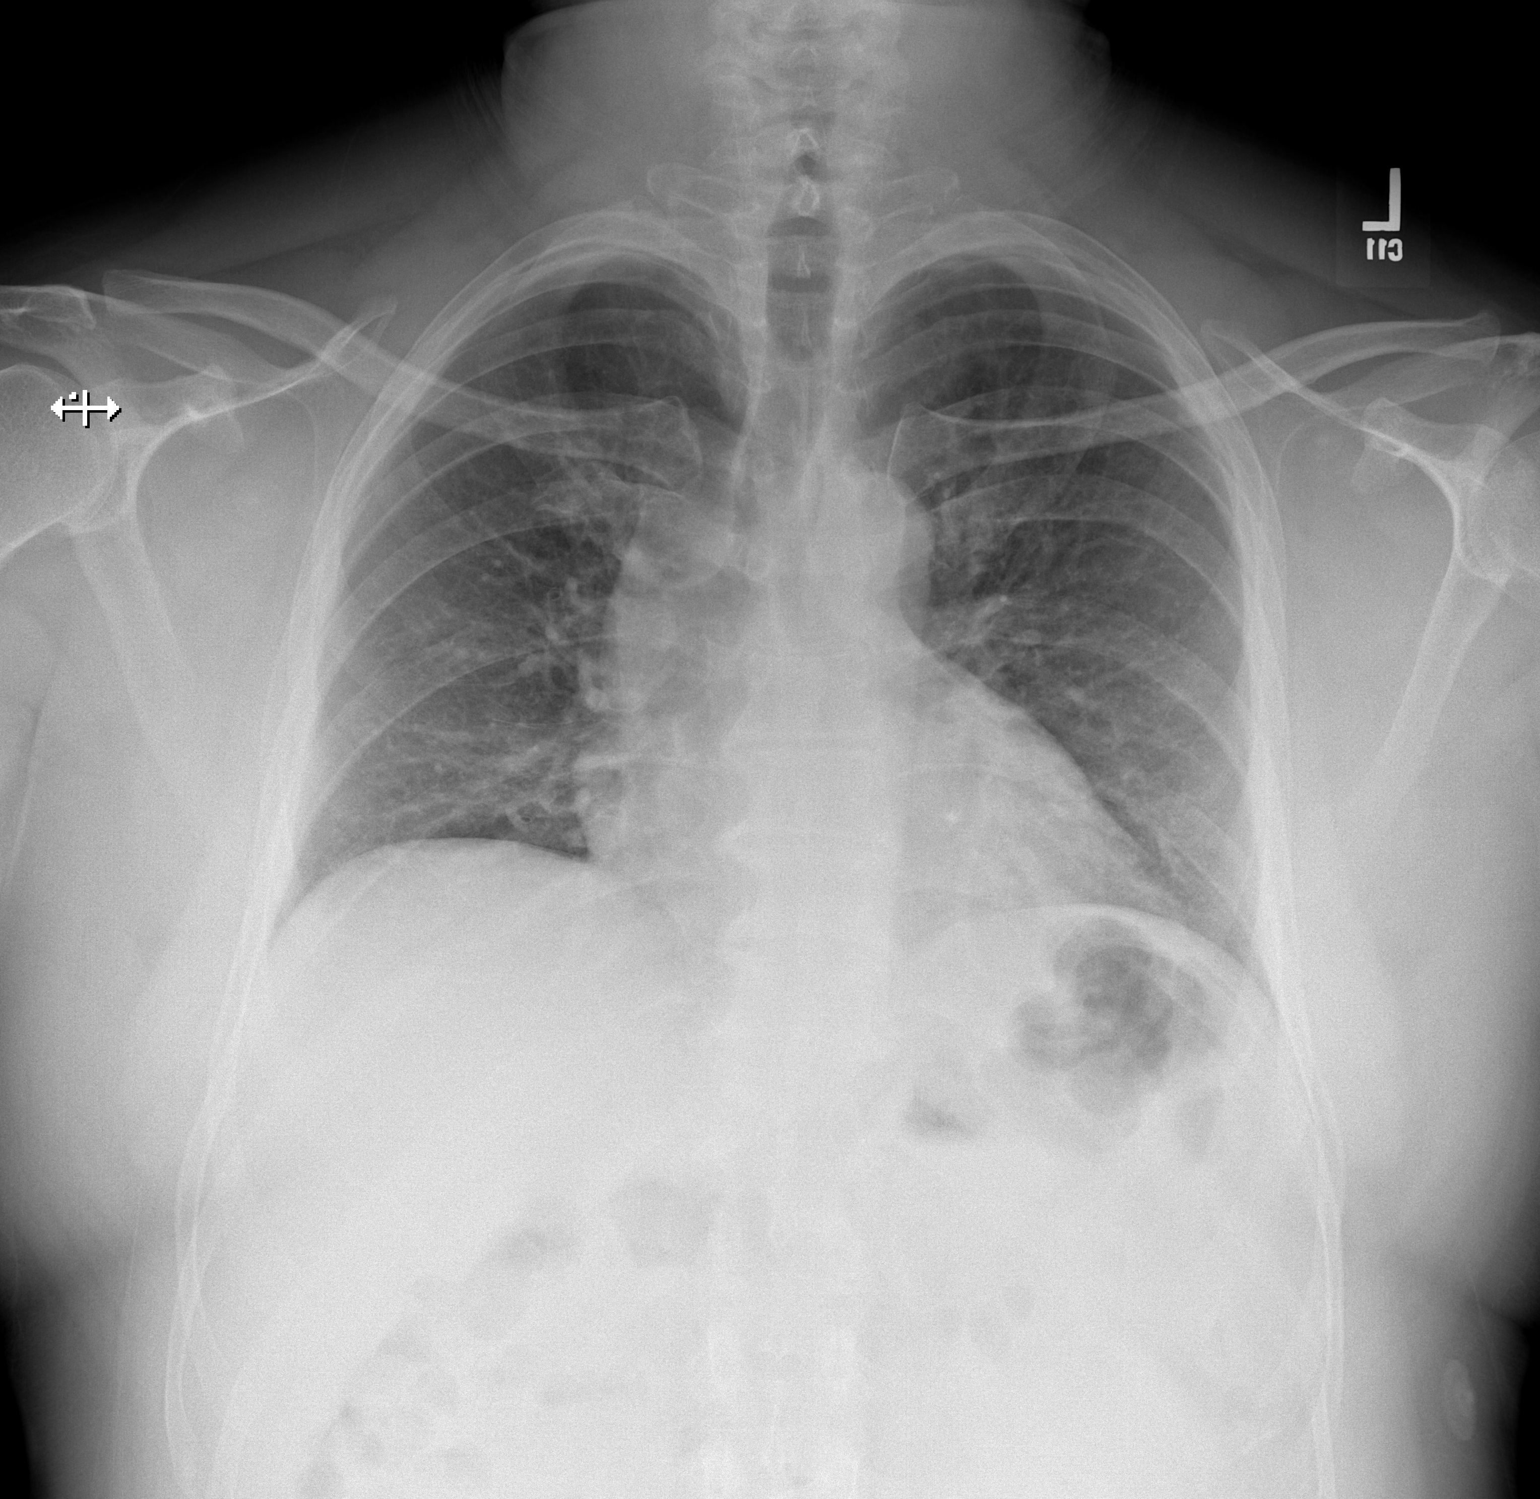

[w chest lat]
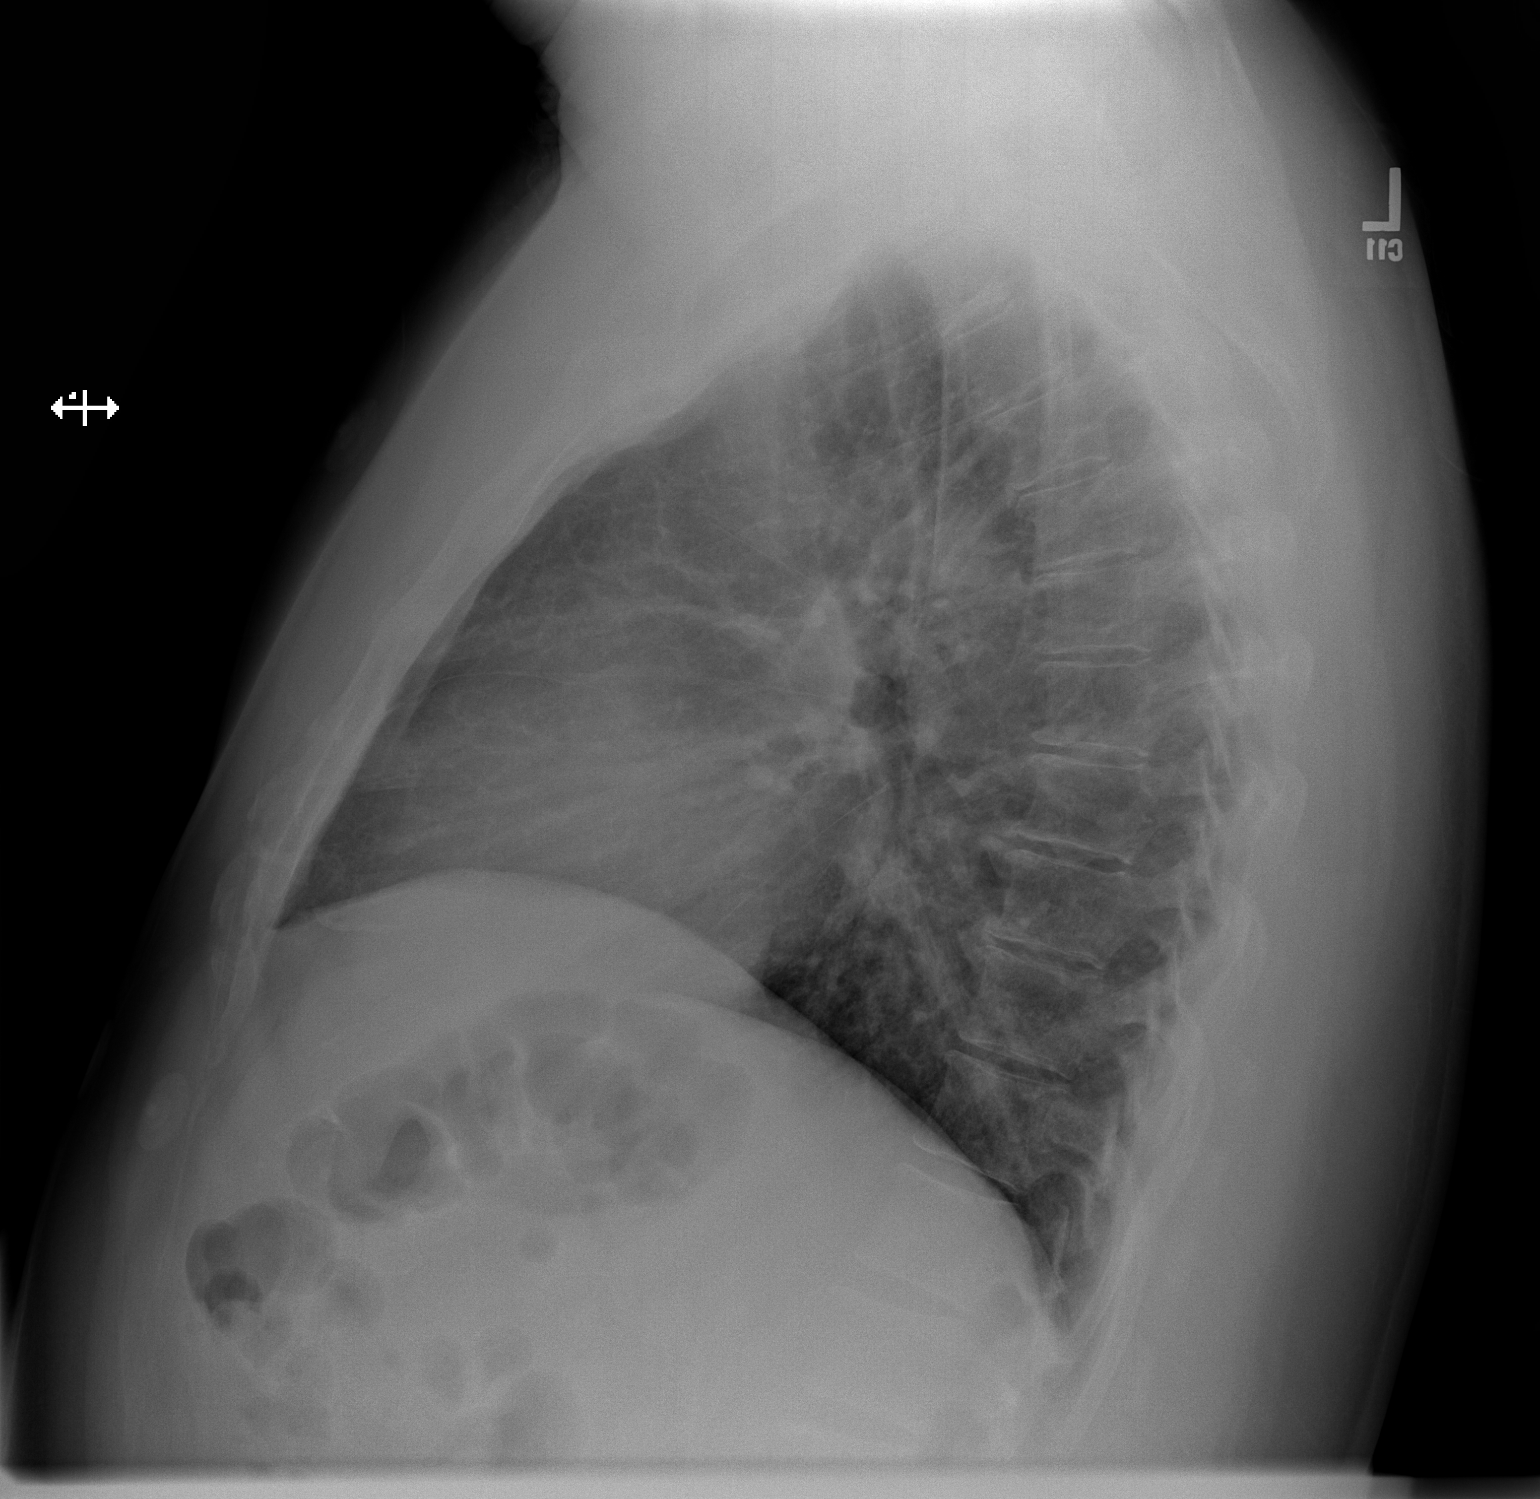

[2 of 2 positions shown; findings below may reference images not displayed]

FINDINGS: Trachea is midline. Heart size stable. Lungs are somewhat low in
volume but clear. No pleural fluid.
IMPRESSION: No acute findings.

## 2021-06-23 ENCOUNTER — Other Ambulatory Visit: Payer: Self-pay | Admitting: Orthopaedic Surgery

## 2021-06-26 NOTE — Telephone Encounter (Signed)
Please advise 

## 2021-07-25 ENCOUNTER — Other Ambulatory Visit: Payer: Self-pay | Admitting: Orthopaedic Surgery

## 2021-09-12 ENCOUNTER — Other Ambulatory Visit: Payer: Self-pay | Admitting: Orthopaedic Surgery

## 2021-11-05 ENCOUNTER — Other Ambulatory Visit: Payer: Self-pay | Admitting: Orthopaedic Surgery

## 2021-11-18 ENCOUNTER — Emergency Department (HOSPITAL_COMMUNITY)
Admission: EM | Admit: 2021-11-18 | Discharge: 2021-11-18 | Disposition: A | Discharge status: Home / Self Care | Payer: BC Managed Care – PPO | Attending: Emergency Medicine | Admitting: Emergency Medicine

## 2021-11-18 ENCOUNTER — Emergency Department (HOSPITAL_COMMUNITY): Payer: BC Managed Care – PPO

## 2021-11-18 ENCOUNTER — Other Ambulatory Visit: Payer: Self-pay

## 2021-11-18 DIAGNOSIS — Z79899 Other long term (current) drug therapy: Secondary | ICD-10-CM | POA: Insufficient documentation

## 2021-11-18 DIAGNOSIS — R002 Palpitations: Secondary | ICD-10-CM | POA: Diagnosis present

## 2021-11-18 LAB — BASIC METABOLIC PANEL
Anion gap: 8 (ref 5–15)
BUN: 15 mg/dL (ref 6–20)
CO2: 23 mmol/L (ref 22–32)
Calcium: 9.1 mg/dL (ref 8.9–10.3)
Chloride: 107 mmol/L (ref 98–111)
Creatinine, Ser: 0.83 mg/dL (ref 0.61–1.24)
GFR, Estimated: >60 mL/min (ref >60)
Glucose, Bld: 89 mg/dL (ref 70–99)
Potassium: 3.6 mmol/L (ref 3.5–5.1)
Sodium: 138 mmol/L (ref 135–145)

## 2021-11-18 LAB — CBC
HCT: 35.4 % — ABNORMAL LOW (ref 39.0–52.0)
Hemoglobin: 12.3 g/dL — ABNORMAL LOW (ref 13.0–17.0)
MCH: 32.6 pg (ref 26.0–34.0)
MCHC: 34.7 g/dL (ref 30.0–36.0)
MCV: 93.9 fL (ref 80.0–100.0)
Platelets: 238 10*3/uL (ref 150–400)
RBC: 3.77 MIL/uL — ABNORMAL LOW (ref 4.22–5.81)
RDW: 12.1 % (ref 11.5–15.5)
WBC: 7.4 10*3/uL (ref 4.0–10.5)
nRBC: 0 % (ref 0.0–0.2)

## 2021-11-18 LAB — TSH: TSH: 2.466 u[IU]/mL (ref 0.350–4.500)

## 2021-11-18 LAB — MAGNESIUM: Magnesium: 2 mg/dL (ref 1.7–2.4)

## 2021-11-18 LAB — TROPONIN I (HIGH SENSITIVITY): Troponin I (High Sensitivity): 3 ng/L (ref <18)

## 2021-11-18 NOTE — ED Triage Notes (Signed)
Pt biba from home with complaints of palpitations in his chest that started around 2300. Pt states he hasnt taken his blood pressure medication in 1 week. Pt took his bp med and an OTC sleeping aid prior to coming to ER as he was trying to go to sleep and decrease palpitations. Per medic pt had pvc's PTA

## 2021-11-18 NOTE — ED Provider Notes (Signed)
[facility] EMERGENCY DEPARTMENT Provider Note   CSN: [id number] Arrival date & time: 11/18/21  0138     History  Chief Complaint  Patient presents with   Palpitations    Pt states palpitations that started around 11pm tonight. Noted pvc's per ems. Pt     Dylan Webster is a 46 y.o. male who presents the emergency department with chief complaint of palpitations.  Patient states that this evening he was lying in bed when he began feeling his heart palpitating.  The palpitations were intermittent but ongoing.  He has not taken his blood pressure medication regularly as this week so took 2 of them at once along with some Gas-X.  This did not help his symptoms.  He called the ambulance and was transported for further evaluation.  During EMS transport run sheet showed he was having frequent PVCs.  He denies chest pain, shortness of breath, new medications, increased medication or medication dose changes, use of stimulants or workout supplements.   Palpitations     Home Medications Prior to Admission medications   Medication Sig Start Date End Date Taking? Authorizing Provider  amLODipine (NORVASC) 5 MG tablet TAKE 1 TABLET BY MOUTH EVERY DAY 07/21/20   Jearld Fenton, NP  diclofenac (VOLTAREN) 75 MG EC tablet TAKE 1 TABLET BY MOUTH TWICE A DAY WITH MEALS 11/06/21   Marybelle Killings, MD      Allergies    Patient has no known allergies.    Review of Systems   Review of Systems  Cardiovascular:  Positive for palpitations.   Physical Exam Updated Vital Signs BP (!) 146/91    Pulse 80    Temp 98.6 F (37 C) (Oral)    Resp 16    SpO2 98%  Physical Exam Vitals and nursing note reviewed.  Constitutional:      General: He is not in acute distress.    Appearance: He is well-developed. He is not diaphoretic.  HENT:     Head: Normocephalic and atraumatic.  Eyes:     General: No scleral icterus.    Conjunctiva/sclera: Conjunctivae normal.  Cardiovascular:     Rate and  Rhythm: Normal rate and regular rhythm.     Heart sounds: Normal heart sounds.  Pulmonary:     Effort: Pulmonary effort is normal. No respiratory distress.     Breath sounds: Normal breath sounds.  Abdominal:     Palpations: Abdomen is soft.     Tenderness: There is no abdominal tenderness.  Musculoskeletal:     Cervical back: Normal range of motion and neck supple.  Skin:    General: Skin is warm and dry.  Neurological:     Mental Status: He is alert.  Psychiatric:        Behavior: Behavior normal.    ED Results / Procedures / Treatments   Labs (all labs ordered are listed, but only abnormal results are displayed) Labs Reviewed - No data to display  EKG None  Radiology No results found.  Procedures Procedures    Medications Ordered in ED Medications - No data to display  ED Course/ Medical Decision Making/ A&P                           Medical Decision Making Patient here with palpitations. The differential diagnosis for palpitations includes cardiac arrhythmias, PVC/PAC, ACS, Cardiomyopathy, CHF, MVP, pericarditis, valvular disease, Panic/Anxiety, Somatic disorder, ETOH, Caffeine,  Stimulant use, medication  side effect, Anemia, Hyperthyroidism, pulmonary embolism. Work up and assessment without findings of emergent cause of sxs.U suspect lack of sleep may be the underlying cause. PERC negative. Will have the patient f/u with cardiology if he continues to have theses sxs.I have discussed return precautions.    Amount and/or Complexity of Data Reviewed Labs: ordered.    Details: cbc mild anemia, bmp, troponin, tsh and mag wnl. Radiology: ordered.    Final Clinical Impression(s) / ED Diagnoses Final diagnoses:  None    Rx / DC Orders ED Discharge Orders     None         Margarita Mail, PA-C 11/19/21 2001    Veryl Speak, MD 11/22/21 [phone]

## 2021-11-18 NOTE — Discharge Instructions (Signed)
Palpitations are feelings that your heartbeat is irregular or is faster than normal. It may feel like your heart is fluttering or skipping a beat. Palpitations may be caused by many things, including smoking, caffeine, alcohol, stress, certain medicines, and drugs. Further tests and a thorough medical history may be done to find the cause of your palpitations. Get help right away if you faint or have chest pain, shortness of breath, severe headache, or dizziness.

## 2021-11-29 ENCOUNTER — Encounter: Payer: Self-pay | Admitting: Internal Medicine

## 2021-11-29 ENCOUNTER — Other Ambulatory Visit: Payer: Self-pay

## 2021-11-29 ENCOUNTER — Ambulatory Visit (INDEPENDENT_AMBULATORY_CARE_PROVIDER_SITE_OTHER): Payer: BC Managed Care – PPO | Admitting: Internal Medicine

## 2021-11-29 VITALS — BP 137/89 | HR 67 | Temp 97.7°F | Ht 69.0 in | Wt 217.0 lb

## 2021-11-29 DIAGNOSIS — F419 Anxiety disorder, unspecified: Secondary | ICD-10-CM

## 2021-11-29 DIAGNOSIS — Z0001 Encounter for general adult medical examination with abnormal findings: Secondary | ICD-10-CM | POA: Diagnosis not present

## 2021-11-29 DIAGNOSIS — Z1159 Encounter for screening for other viral diseases: Secondary | ICD-10-CM

## 2021-11-29 DIAGNOSIS — E6609 Other obesity due to excess calories: Secondary | ICD-10-CM

## 2021-11-29 DIAGNOSIS — M170 Bilateral primary osteoarthritis of knee: Secondary | ICD-10-CM

## 2021-11-29 DIAGNOSIS — I10 Essential (primary) hypertension: Secondary | ICD-10-CM

## 2021-11-29 DIAGNOSIS — G4733 Obstructive sleep apnea (adult) (pediatric): Secondary | ICD-10-CM | POA: Diagnosis not present

## 2021-11-29 DIAGNOSIS — Z1211 Encounter for screening for malignant neoplasm of colon: Secondary | ICD-10-CM | POA: Diagnosis not present

## 2021-11-29 DIAGNOSIS — Z114 Encounter for screening for human immunodeficiency virus [HIV]: Secondary | ICD-10-CM

## 2021-11-29 DIAGNOSIS — K219 Gastro-esophageal reflux disease without esophagitis: Secondary | ICD-10-CM

## 2021-11-29 DIAGNOSIS — Z6832 Body mass index (BMI) 32.0-32.9, adult: Secondary | ICD-10-CM

## 2021-11-29 DIAGNOSIS — E66811 Obesity, class 1: Secondary | ICD-10-CM | POA: Insufficient documentation

## 2021-11-29 MED ORDER — HYDROXYZINE HCL 10 MG PO TABS: 10.0000 mg | ORAL_TABLET | Freq: Every day | ORAL | 2 refills | Status: DC | PRN

## 2021-11-29 MED ORDER — OMEPRAZOLE 20 MG PO CPDR: 20.0000 mg | DELAYED_RELEASE_CAPSULE | Freq: Every day | ORAL | 1 refills | Status: DC

## 2021-11-29 MED ORDER — AMLODIPINE BESYLATE 5 MG PO TABS: 5.0000 mg | ORAL_TABLET | Freq: Two times a day (BID) | ORAL | 1 refills | Status: DC

## 2021-11-29 NOTE — Progress Notes (Signed)
Subjective:    Patient ID: Dylan Webster, male    DOB: [birth year], 46 y.o.   MRN: [id number]  HPI  Patient presents to clinic today for his annual exam.  He is also due to follow-up chronic conditions.  HTN: His BP today is 137/89.  He is taking Amlodipine as prescribed.  ECG from 11/2021 reviewed.  OSA: He averages 6 hours of sleep per night with the use of CPAP.  Sleep study from 03/2019 reviewed.  Anxiety: Intermittent. He has taking Buspirone and Hydroxyzine in the past but does not currently have a RX for this. He is not currently seeing a therapist. He denies depression, SI/HI.  OA: Mainly in his knees.  He takes as Diclofenac as needed with good relief of symptoms.  He does follow with orthopedics.  Anemia: His last H/H was 12.3/35.4, 11/2021.  He is not taking any oral iron at this time.  He does not follow with hematology.  GERD: Triggered by red meat and laying down after eating.  He is not taking any medications for this at this time.  There is no upper GI on file.  Flu: 06/2020 Tetanus: 06/2020 COVID: Moderna x3 Colon screening: Never Vision screening: as needed Dentist: as needed  Diet: He does eat meat. He eats more veggies than fruits. He does eat some fried foods. He drinks mostly water. Exercise: None  Review of Systems     Past Medical History:  Diagnosis Date   Allergy    HTN (hypertension)    OSA (obstructive sleep apnea)    Osteoarthritis of knees, bilateral     Current Outpatient Medications  Medication Sig Dispense Refill   acetaminophen (TYLENOL) 500 MG tablet Take 500 mg by mouth every 6 (six) hours as needed for mild pain or moderate pain.     amLODipine (NORVASC) 5 MG tablet TAKE 1 TABLET BY MOUTH EVERY DAY (Patient taking differently: Take 5 mg by mouth daily.) 90 tablet 2   diclofenac (VOLTAREN) 75 MG EC tablet TAKE 1 TABLET BY MOUTH TWICE A DAY WITH MEALS (Patient taking differently: Take 75 mg by mouth 2 (two) times daily. TAKE 1 TABLET BY MOUTH  TWICE A DAY WITH MEALS) 60 tablet 0   No current facility-administered medications for this visit.    No Known Allergies  Family History  Problem Relation Age of Onset   Alcohol abuse Father    Heart disease Father    Alcohol abuse Mother    Diabetes Mother    Hyperlipidemia Maternal Aunt    Heart disease Paternal Uncle    Hypertension Paternal Uncle    Alcohol abuse Paternal Grandfather    Hyperlipidemia Paternal Grandfather    Heart disease Paternal Grandfather    Hypertension Paternal Grandfather    Diabetes Paternal Grandfather    Kidney disease Paternal Grandfather    Leukemia Maternal Aunt     Social History   Socioeconomic History   Marital status: Married    Spouse name: Not on file   Number of children: 4   Years of education: Not on file   Highest education level: Not on file  Occupational History   Occupation: maintenance  Tobacco Use   Smoking status: Former    Packs/day: 1.00    Years: 12.00    Pack years: 12.00    Types: Cigarettes    Quit date: 12/22/2018    Years since quitting: 2.9   Smokeless tobacco: Never   Tobacco comments:    quit 12/2018  Vaping Use   Vaping Use: Never used  Substance and Sexual Activity   Alcohol use: No   Drug use: No   Sexual activity: Yes    Birth control/protection: None  Other Topics Concern   Not on file  Social History Narrative   ** Merged History Encounter **       Married, 4 children  Job: [employer]  Will start exercising regularly   Social Determinants of Health   Financial Resource Strain: Not on file  Food Insecurity: Not on file  Transportation Needs: Not on file  Physical Activity: Not on file  Stress: Not on file  Social Connections: Not on file  Intimate Partner Violence: Not on file     Constitutional: Denies fever, malaise, fatigue, headache or abrupt weight changes.  HEENT: Denies eye pain, eye redness, ear pain, ringing in the ears, wax buildup, runny nose, nasal  congestion, bloody nose, or sore throat. Respiratory: Denies difficulty breathing, shortness of breath, cough or sputum production.   Cardiovascular: Patient reports palpitations.  Denies chest pain, chest tightness, palpitations or swelling in the hands or feet.  Gastrointestinal: Patient reports intermittent reflux.  Denies abdominal pain, bloating, constipation, diarrhea or blood in the stool.  GU: Denies urgency, frequency, pain with urination, burning sensation, blood in urine, odor or discharge. Musculoskeletal: Patient reports joint pain in knees.  Denies decrease in range of motion, difficulty with gait, muscle pain or joint swelling.  Skin: Denies redness, rashes, lesions or ulcercations.  Neurological: Denies dizziness, difficulty with memory, difficulty with speech or problems with balance and coordination.  Psych: Patient has a history of anxiety.  Denies depression, SI/HI.  No other specific complaints in a complete review of systems (except as listed in HPI above).  Objective:   Physical Exam  BP 137/89 (BP Location: Right Arm, Patient Position: Sitting, Cuff Size: Large)    Pulse 67    Temp 97.7 F (36.5 C) (Temporal)    Ht 5\' 9"  (1.753 m)    Wt 217 lb (98.4 kg)    SpO2 99%    BMI 32.05 kg/m   Wt Readings from Last 3 Encounters:  04/07/21 227 lb (103 kg)  01/24/21 230 lb 12.8 oz (104.7 kg)  06/21/20 221 lb (100.2 kg)    General: Appears his stated age, obese, in NAD. Skin: Warm, dry and intact.  HEENT: Head: normal shape and size; Eyes: sclera white and EOMs intact;  Neck:  Neck supple, trachea midline. No masses, lumps or thyromegaly present.  Cardiovascular: Normal rate and rhythm. S1,S2 noted.  No murmur, rubs or gallops noted. No JVD or BLE edema.  Pulmonary/Chest: Normal effort and positive vesicular breath sounds. No respiratory distress. No wheezes, rales or ronchi noted.  Abdomen: Soft and nontender. Normal bowel sounds.  Musculoskeletal: Strength 5/5 BUE/BLE.   No difficulty with gait.  Neurological: Alert and oriented. Cranial nerves II-XII grossly intact. Coordination normal.  Psychiatric: Mood and affect normal. Behavior is normal. Judgment and thought content normal.     BMET    Component Value Date/Time   NA 138 11/18/2021 0237   NA 141 12/30/2019 1423   K 3.6 11/18/2021 0237   CL 107 11/18/2021 0237   CO2 23 11/18/2021 0237   GLUCOSE 89 11/18/2021 0237   BUN 15 11/18/2021 0237   BUN 16 12/30/2019 1423   CREATININE 0.83 11/18/2021 0237   CALCIUM 9.1 11/18/2021 0237   GFRNONAA >60 11/18/2021 0237   GFRAA 128 12/30/2019 1423  Lipid Panel     Component Value Date/Time   CHOL 166 06/09/2020 1553   TRIG 97.0 06/09/2020 1553   HDL 47.70 06/09/2020 1553   CHOLHDL 3 06/09/2020 1553   VLDL 19.4 06/09/2020 1553   LDLCALC 99 06/09/2020 1553    CBC    Component Value Date/Time   WBC 7.4 11/18/2021 0237   RBC 3.77 (L) 11/18/2021 0237   HGB 12.3 (L) 11/18/2021 0237   HCT 35.4 (L) 11/18/2021 0237   PLT 238 11/18/2021 0237   MCV 93.9 11/18/2021 0237   MCH 32.6 11/18/2021 0237   MCHC 34.7 11/18/2021 0237   RDW 12.1 11/18/2021 0237   LYMPHSABS 3.8 04/07/2021 0238   MONOABS 0.7 04/07/2021 0238   EOSABS 0.3 04/07/2021 0238   BASOSABS 0.0 04/07/2021 0238    Hgb A1C Lab Results  Component Value Date   HGBA1C 4.8 06/09/2020           Assessment & Plan:   Preventative Health Maintenance:  He declines flu shot today Tetanus UTD Encouraged him to get his [place] booster Referral to GI for screening colonoscopy Encouraged him to consume a balanced diet and exercise regimen Advised him to see an eye doctor and dentist annually We will check lipid, A1c, HIV and hep C today  RTC in 6 months, follow-up chronic conditions Webb Silversmith, NP This visit occurred during the SARS-CoV-2 public health emergency.  Safety protocols were in place, including screening questions prior to the visit, additional usage of staff PPE, and  extensive cleaning of exam room while observing appropriate contact time as indicated for disinfecting solutions.

## 2021-11-29 NOTE — Patient Instructions (Signed)

## 2021-11-29 NOTE — Assessment & Plan Note (Signed)
Encourage diet and exercise for weight loss 

## 2021-11-29 NOTE — Progress Notes (Signed)
Cardiology Office Note:   Date:  11/30/2021  NAME:  Dylan Webster    MRN: [id number] DOB:  1976/08/11   PCP:  Jearld Fenton, NP  Cardiologist:  None  Electrophysiologist:  None   Referring MD: Jearld Fenton, NP   Chief Complaint  Patient presents with   Follow-up    1 year.   History of Present Illness:   Dylan Webster is a 46 y.o. male with a hx of HTN, obesity who presents for follow-up. Seen in ER 11/18/2021 for palpitations and found to have PVCs. TSH 2.46.  He reports he was having palpitations.  Described as pounding in his chest.  Would occur periodically.  No triggers.  Symptoms only occurred with the dizziness and lightheadedness.  He reports no shortness of breath.  Reports it would occur daily.  They were occurring often.  This also coincided with him not taking his blood pressure medication.  Apparently was a busy week for him and he had to get his kids ready for school.  He was unable to take his blood pressure medication.  His blood pressure in the emergency room was very elevated.  He did have PVCs documented.  Recent blood work shows normal kidney function as well as normal thyroid function.  He denies any chest pain or trouble breathing.  He is working on salt reduction.  Symptoms of PVCs and palpitations have resolved.  He does have a family history of hypertrophic cardiomyopathy which bothers me.  He will have life insurance testing done in the next week or so.  We will then test him for the specific gene that causes uncles hypertrophic cardiomyopathy.  Problem  HTN Obesity Family history of heart disease -uncle with MYBPC3 HCM 4. OSA 5. PVC  Past Medical History: Past Medical History:  Diagnosis Date   Allergy    HTN (hypertension)    OSA (obstructive sleep apnea)    Osteoarthritis of knees, bilateral     Past Surgical History: Past Surgical History:  Procedure Laterality Date   NO PAST SURGERIES      Current Medications: Current Meds  Medication Sig    acetaminophen (TYLENOL) 500 MG tablet Take 500 mg by mouth every 6 (six) hours as needed for mild pain or moderate pain.   amLODipine (NORVASC) 5 MG tablet Take 1 tablet (5 mg total) by mouth 2 (two) times daily.   diclofenac (VOLTAREN) 75 MG EC tablet TAKE 1 TABLET BY MOUTH TWICE A DAY WITH MEALS (Patient taking differently: Take 75 mg by mouth 2 (two) times daily. TAKE 1 TABLET BY MOUTH TWICE A DAY WITH MEALS)   hydrOXYzine (ATARAX) 10 MG tablet Take 1 tablet (10 mg total) by mouth daily as needed.   omeprazole (PRILOSEC) 20 MG capsule Take 1 capsule (20 mg total) by mouth daily.     Allergies:    Patient has no known allergies.   Social History: Social History   Socioeconomic History   Marital status: Married    Spouse name: Not on file   Number of children: 4   Years of education: Not on file   Highest education level: Not on file  Occupational History   Occupation: maintenance  Tobacco Use   Smoking status: Former    Packs/day: 1.00    Years: 12.00    Pack years: 12.00    Types: Cigarettes    Quit date: 12/22/2018    Years since quitting: 2.9   Smokeless tobacco: Never   Tobacco  comments:    quit 12/2018  Vaping Use   Vaping Use: Never used  Substance and Sexual Activity   Alcohol use: No   Drug use: No   Sexual activity: Yes    Birth control/protection: None  Other Topics Concern   Not on file  Social History Narrative   ** Merged History Encounter **       Married, 4 children  Job: [employer]  Will start exercising regularly   Social Determinants of Health   Financial Resource Strain: Not on file  Food Insecurity: Not on file  Transportation Needs: Not on file  Physical Activity: Not on file  Stress: Not on file  Social Connections: Not on file     Family History: The patient's family history includes Alcohol abuse in his father, mother, and paternal grandfather; Diabetes in his mother and paternal grandfather; Heart disease in his  father, paternal grandfather, and paternal uncle; Hyperlipidemia in his maternal aunt and paternal grandfather; Hypertension in his paternal grandfather and paternal uncle; Kidney disease in his paternal grandfather; Leukemia in his maternal aunt.  ROS:   All other ROS reviewed and negative. Pertinent positives noted in the HPI.     EKGs/Labs/Other Studies Reviewed:   The following studies were personally reviewed by me today:  EKG: EKG reviewed from the emergency room on 11/21/2021 demonstrated LVH with PVC.  CCTA 12/31/2019  IMPRESSION: 1. Coronary calcium score of 0. 2. Normal coronary origin with right dominance. 3. Normal coronary arteries.  TTE 12/03/2019  1. Left ventricular ejection fraction, by estimation, is 60 to 65%. The  left ventricle has normal function. The left ventricle has no regional  wall motion abnormalities. Left ventricular diastolic parameters were  normal. The average left ventricular  global longitudinal strain is -19.7 %.   2. Right ventricular systolic function is normal. The right ventricular  size is normal. There is normal pulmonary artery systolic pressure. The  estimated right ventricular systolic pressure is 62.3 mmHg.   3. The mitral valve is grossly normal. No evidence of mitral valve  regurgitation. No evidence of mitral stenosis.   4. The aortic valve is tricuspid. Aortic valve regurgitation is not  visualized. No aortic stenosis is present.   5. The inferior vena cava is normal in size with greater than 50%  respiratory variability, suggesting right atrial pressure of 3 mmHg.   Recent Labs: 11/18/2021: BUN 15; Creatinine, Ser 0.83; Hemoglobin 12.3; Magnesium 2.0; Platelets 238; Potassium 3.6; Sodium 138; TSH 2.466   Recent Lipid Panel    Component Value Date/Time   CHOL 165 11/29/2021 0908   TRIG 69 11/29/2021 0908   HDL 42 11/29/2021 0908   CHOLHDL 3.9 11/29/2021 0908   VLDL 19.4 06/09/2020 1553   LDLCALC 107 (H) 11/29/2021 0908     Physical Exam:   VS:  BP 126/80 (BP Location: Left Arm, Patient Position: Sitting, Cuff Size: Normal)    Pulse 70    Ht 5\' 9"  (1.753 m)    Wt 215 lb (97.5 kg)    BMI 31.75 kg/m    Wt Readings from Last 3 Encounters:  11/30/21 215 lb (97.5 kg)  11/29/21 217 lb (98.4 kg)  04/07/21 227 lb (103 kg)    General: Well nourished, well developed, in no acute distress Head: Atraumatic, normal size  Eyes: PEERLA, EOMI  Neck: Supple, no JVD Endocrine: No thryomegaly Cardiac: Normal S1, S2; RRR; no murmurs, rubs, or gallops Lungs: Clear to auscultation bilaterally, no wheezing, rhonchi  or rales  Abd: Soft, nontender, no hepatomegaly  Ext: No edema, pulses 2+ Musculoskeletal: No deformities, BUE and BLE strength normal and equal Skin: Warm and dry, no rashes   Neuro: Alert and oriented to person, place, time, and situation, CNII-XII grossly intact, no focal deficits  Psych: Normal mood and affect   ASSESSMENT:   Dylan Webster is a 46 y.o. male who presents for the following: 1. Family history of hypertrophic cardiomyopathy   2. Essential hypertension   3. Palpitations   4. PVC (premature ventricular contraction)     PLAN:   1. Family history of hypertrophic cardiomyopathy -Uncle with hypertrophic cardiomyopathy.  Genetically positive with MYBPC3 mutation.  Echocardiogram in 2021 screen negative.  Now with symptoms of PVCs and palpitations.  I believe his PVCs were triggered by uncontrolled hypertension when he stopped taking his medication.  Thyroid is normal.  We will repeat his echocardiogram to make sure everything is normal.  He also will let us know in the next week or 2 and we can order genetic test.  He is going to undergo this testing.  We will only testing for the specific gene.  2. Essential hypertension -I believe this was a trigger for his palpitations.  Repeating echo as above.  He will continue amlodipine 5 mg daily.  BP well controlled.  He also has sleep apnea and is using  his mask.  3. Palpitations 4. PVC (premature ventricular contraction) -Symptoms of palpitations that occur with PVCs.  Symptoms have resolved.  No need for monitor at this point.  Repeat echocardiogram given family history of hypertrophic cardiomyopathy.  May need to monitor pending on results of the echo.  We have plans for genetic testing in the future.      Disposition: Return in about 1 year (around 11/30/2022).  Medication Adjustments/Labs and Tests Ordered: Current medicines are reviewed at length with the patient today.  Concerns regarding medicines are outlined above.  Orders Placed This Encounter  Procedures   ECHOCARDIOGRAM COMPLETE   No orders of the defined types were placed in this encounter.   Patient Instructions  Medication Instructions:  The current medical regimen is effective;  continue present plan and medications.  *If you need a refill on your cardiac medications before your next appointment, please call your pharmacy*   Testing/Procedures: Echocardiogram - Your physician has requested that you have an echocardiogram. Echocardiography is a painless test that uses sound waves to create images of your heart. It provides your doctor with information about the size and shape of your heart and how well your hearts chambers and valves are working. This procedure takes approximately one hour. There are no restrictions for this procedure. This will be performed at either our [facility] location - [address] [place] location [facility] 2nd floor.    Follow-Up: At [facility], you and your health needs are our priority.  As part of our continuing mission to provide you with exceptional heart care, we have created designated Provider Care Teams.  These Care Teams include your primary Cardiologist (physician) and Advanced Practice Providers (APPs -  Physician Assistants and Nurse Practitioners) who all work together to provide you  with the care you need, when you need it.  We recommend signing up for the patient portal called "MyChart".  Sign up information is provided on this After Visit Summary.  MyChart is used to connect with patients for Virtual Visits (Telemedicine).  Patients are able to  view lab/test results, encounter notes, upcoming appointments, etc.  Non-urgent messages can be sent to your provider as well.   To learn more about what you can do with MyChart, go to [email or web address].    Your next appointment:   12 month(s)  The format for your next appointment:   In Person  Provider:   Eleonore Chiquito, MD    Other Instructions Call us when you are ready to do the genetic testing.     Time Spent with Patient: I have spent a total of 35 minutes with patient reviewing hospital notes, telemetry, EKGs, labs and examining the patient as well as establishing an assessment and plan that was discussed with the patient.  > 50% of time was spent in direct patient care.  Signed, Addison Naegeli. Audie Box, MD, [place]  [address] [place], [place] [address] [phone]  11/30/2021 10:17 AM

## 2021-11-29 NOTE — Assessment & Plan Note (Signed)
Encourage weight loss as this can help reduce sleep apnea symptoms Continue CPAP 

## 2021-11-29 NOTE — Assessment & Plan Note (Signed)
Encourage weight loss as this can reduce reflux symptoms Avoid foods that trigger reflux Rx for Omeprazole 20 mg daily 30 minutes before breakfast We will monitor

## 2021-11-29 NOTE — Assessment & Plan Note (Signed)
Will restart Hydroxyzine 10 mg daily as needed Support offered

## 2021-11-29 NOTE — Assessment & Plan Note (Signed)
Encourage weight loss as this can help reduce joint pain Encouraged regular physical activity Continue Diclofenac as needed

## 2021-11-29 NOTE — Assessment & Plan Note (Signed)
Controlled on Amlodipine, refilled today Reinforced DASH diet and exercise for weight loss Kidney function reviewed

## 2021-11-30 ENCOUNTER — Encounter: Payer: Self-pay | Admitting: Cardiovascular Disease

## 2021-11-30 ENCOUNTER — Ambulatory Visit: Payer: BC Managed Care – PPO | Admitting: Cardiovascular Disease

## 2021-11-30 VITALS — BP 126/80 | HR 70 | Ht 69.0 in | Wt 215.0 lb

## 2021-11-30 DIAGNOSIS — I493 Ventricular premature depolarization: Secondary | ICD-10-CM

## 2021-11-30 DIAGNOSIS — Z8249 Family history of ischemic heart disease and other diseases of the circulatory system: Secondary | ICD-10-CM | POA: Diagnosis not present

## 2021-11-30 DIAGNOSIS — R002 Palpitations: Secondary | ICD-10-CM

## 2021-11-30 DIAGNOSIS — I10 Essential (primary) hypertension: Secondary | ICD-10-CM

## 2021-11-30 LAB — HEPATITIS C ANTIBODY
Hepatitis C Ab: NONREACTIVE
SIGNAL TO CUT-OFF: 0.08 (ref <1.00)

## 2021-11-30 LAB — LIPID PANEL
Cholesterol: 165 mg/dL (ref <200)
HDL: 42 mg/dL (ref >=40)
LDL Cholesterol (Calc): 107 mg/dL (calc) — ABNORMAL HIGH
Non-HDL Cholesterol (Calc): 123 mg/dL (calc) (ref <130)
Total CHOL/HDL Ratio: 3.9 (calc) (ref <5.0)
Triglycerides: 69 mg/dL (ref <150)

## 2021-11-30 LAB — HIV ANTIBODY (ROUTINE TESTING W REFLEX): HIV 1&2 Ab, 4th Generation: NONREACTIVE

## 2021-11-30 LAB — TEST AUTHORIZATION: TEST CODE:: 16802

## 2021-11-30 LAB — HEMOGLOBIN A1C
Hgb A1c MFr Bld: 4.7 % of total Hgb (ref <5.7)
Mean Plasma Glucose: 88 mg/dL
eAG (mmol/L): 4.9 mmol/L

## 2021-11-30 NOTE — Patient Instructions (Signed)
Medication Instructions:  The current medical regimen is effective;  continue present plan and medications.  *If you need a refill on your cardiac medications before your next appointment, please call your pharmacy*   Testing/Procedures: Echocardiogram - Your physician has requested that you have an echocardiogram. Echocardiography is a painless test that uses sound waves to create images of your heart. It provides your doctor with information about the size and shape of your heart and how well your hearts chambers and valves are working. This procedure takes approximately one hour. There are no restrictions for this procedure. This will be performed at either our [facility] location - [address] [place] location [facility] 2nd floor.    Follow-Up: At [facility], you and your health needs are our priority.  As part of our continuing mission to provide you with exceptional heart care, we have created designated Provider Care Teams.  These Care Teams include your primary Cardiologist (physician) and Advanced Practice Providers (APPs -  Physician Assistants and Nurse Practitioners) who all work together to provide you with the care you need, when you need it.  We recommend signing up for the patient portal called "MyChart".  Sign up information is provided on this After Visit Summary.  MyChart is used to connect with patients for Virtual Visits (Telemedicine).  Patients are able to view lab/test results, encounter notes, upcoming appointments, etc.  Non-urgent messages can be sent to your provider as well.   To learn more about what you can do with MyChart, go to [email or web address].    Your next appointment:   12 month(s)  The format for your next appointment:   In Person  Provider:   Eleonore Chiquito, MD    Other Instructions Call us when you are ready to do the genetic testing.

## 2021-12-01 ENCOUNTER — Encounter: Payer: Self-pay | Admitting: Internal Medicine

## 2021-12-07 ENCOUNTER — Encounter: Payer: Self-pay | Admitting: Cardiovascular Disease

## 2021-12-10 ENCOUNTER — Other Ambulatory Visit: Payer: Self-pay | Admitting: Orthopaedic Surgery

## 2021-12-12 ENCOUNTER — Other Ambulatory Visit: Payer: Self-pay

## 2021-12-12 ENCOUNTER — Ambulatory Visit (HOSPITAL_COMMUNITY): Payer: BC Managed Care – PPO | Attending: Cardiovascular Disease

## 2021-12-12 DIAGNOSIS — I493 Ventricular premature depolarization: Secondary | ICD-10-CM

## 2021-12-12 DIAGNOSIS — I10 Essential (primary) hypertension: Secondary | ICD-10-CM | POA: Diagnosis not present

## 2021-12-12 LAB — ECHOCARDIOGRAM COMPLETE
Area-P 1/2: 4.53 cm2
S' Lateral: 3.4 cm

## 2021-12-13 ENCOUNTER — Encounter: Payer: Self-pay | Admitting: Cardiovascular Disease

## 2021-12-13 ENCOUNTER — Encounter: Payer: Self-pay | Admitting: Internal Medicine

## 2021-12-14 ENCOUNTER — Encounter: Payer: Self-pay | Admitting: Gastroenterology

## 2022-01-02 ENCOUNTER — Ambulatory Visit: Payer: BC Managed Care – PPO | Admitting: Gastroenterology

## 2022-01-02 ENCOUNTER — Encounter: Payer: Self-pay | Admitting: Gastroenterology

## 2022-01-02 VITALS — BP 136/88 | HR 67 | Ht 68.0 in | Wt 220.0 lb

## 2022-01-02 DIAGNOSIS — Z1211 Encounter for screening for malignant neoplasm of colon: Secondary | ICD-10-CM

## 2022-01-02 DIAGNOSIS — K219 Gastro-esophageal reflux disease without esophagitis: Secondary | ICD-10-CM | POA: Diagnosis not present

## 2022-01-02 DIAGNOSIS — R194 Change in bowel habit: Secondary | ICD-10-CM | POA: Diagnosis not present

## 2022-01-02 MED ORDER — SUTAB 1479-225-188 MG PO TABS: 1.0000 | ORAL_TABLET | Freq: Once | ORAL | 0 refills | Status: AC

## 2022-01-02 NOTE — Patient Instructions (Addendum)
If you are age 45 or older, your body mass index should be between 23-30. Your Body mass index is 33.45 kg/m?Marland Kitchen If this is out of the aforementioned range listed, please consider follow up with your Primary Care Provider. ? ?If you are age 86 or younger, your body mass index should be between 19-25. Your Body mass index is 33.45 kg/m?Marland Kitchen If this is out of the aformentioned range listed, please consider follow up with your Primary Care Provider.  ? ?The [facility] providers would like to encourage you to use [facility] to communicate with providers for non-urgent requests or questions.  Due to long hold times on the telephone, sending your provider a message by [facility] may be a faster and more efficient way to get a response.  Please allow 48 business hours for a response.  Please remember that this is for non-urgent requests.  ? ?You have been scheduled for a colonoscopy. Please follow written instructions given to you at your visit today.  ?Please pick up your prep supplies at the pharmacy within the next 1-3 days. ?If you use inhalers (even only as needed), please bring them with you on the day of your procedure. ? ? ?Please purchase the following medications over the counter and take as directed: ?Metamucil: Use daily ?Gaviscon: Take at night as needed ? ?Thank you for choosing me and [facility]. ?Dr. Candis Schatz ?

## 2022-01-02 NOTE — Progress Notes (Signed)
? ?HPI : Dylan Webster is a very pleasant 46 year old male with a history of obstructive sleep apnea who is referred to [place] by Golden Hurter, NP for initial average risk screening colonoscopy.  The patient denies any family history of colon cancer.  He has been having issues with irregular bowel habits the past few years.  He reports problems with diarrhea and loose stools on occasion, particularly when he eats eggs or mayonnaise.  He is sometimes bothered by constipation and infrequent bowel movements with hard stools.  Most the time he has a bowel movement every day and denies problems with straining.  He denies any blood in the stool. ? ?He also reports having typical GERD symptoms the past few years.  He had been having them on a daily basis until he started taking omeprazole about a month ago.  His symptoms consist of burning pain in the epigastrium and chest, acid regurgitation, fullness in the chest with nausea, postprandial coughing episodes.  He would awaken frequently from sleep with the symptoms.  Since starting the omeprazole, this has essentially eliminated the heartburn and acid regurgitation.  He occasionally has coughing episodes and throat irritation.  He denies symptoms of dysphagia.  No unintentional weight loss. ? ? ? ?Past Medical History:  ?Diagnosis Date  ? Allergy   ? HTN (hypertension)   ? OSA (obstructive sleep apnea)   ? Osteoarthritis of knees, bilateral   ? ? ? ?Past Surgical History:  ?Procedure Laterality Date  ? NO PAST SURGERIES    ? ?Family History  ?Problem Relation Age of Onset  ? Alcohol abuse Mother   ? Diabetes Mother   ? Alcohol abuse Father   ? Heart disease Father   ? Alcohol abuse Paternal Grandfather   ? Hyperlipidemia Paternal Grandfather   ? Heart disease Paternal Grandfather   ? Hypertension Paternal Grandfather   ? Diabetes Paternal Grandfather   ? Kidney disease Paternal Grandfather   ? Hyperlipidemia Maternal Aunt   ? Leukemia Maternal Aunt   ? Heart disease Paternal  Uncle   ? Hypertension Paternal Uncle   ? Colon cancer Neg Hx   ? Stomach cancer Neg Hx   ? Esophageal cancer Neg Hx   ? ?Social History  ? ?Tobacco Use  ? Smoking status: Former  ?  Packs/day: 1.00  ?  Years: 12.00  ?  Pack years: 12.00  ?  Types: Cigarettes  ?  Quit date: 12/22/2018  ?  Years since quitting: 3.0  ? Smokeless tobacco: Never  ? Tobacco comments:  ?  quit 12/2018  ?Vaping Use  ? Vaping Use: Never used  ?Substance Use Topics  ? Alcohol use: No  ? Drug use: No  ? ?Current Outpatient Medications  ?Medication Sig Dispense Refill  ? acetaminophen (TYLENOL) 500 MG tablet Take 500 mg by mouth every 6 (six) hours as needed for mild pain or moderate pain.    ? amLODipine (NORVASC) 5 MG tablet Take 1 tablet (5 mg total) by mouth 2 (two) times daily. 180 tablet 1  ? diclofenac (VOLTAREN) 75 MG EC tablet TAKE 1 TABLET BY MOUTH TWICE A DAY WITH MEALS 60 tablet 0  ? hydrOXYzine (ATARAX) 10 MG tablet Take 1 tablet (10 mg total) by mouth daily as needed. 30 tablet 2  ? omeprazole (PRILOSEC) 20 MG capsule Take 1 capsule (20 mg total) by mouth daily. 90 capsule 1  ? ?No current facility-administered medications for this visit.  ? ?No Known Allergies ? ? ?Review  of Systems: ?All systems reviewed and negative except where noted in HPI.  ? ? ?ECHOCARDIOGRAM COMPLETE ? ?Result Date: 12/12/2021 ?   ECHOCARDIOGRAM REPORT   Patient Name:   Dylan Webster Date of Exam: 12/12/2021 Medical Rec #:  [id number]     Height:       69.0 in Accession #:    [id number]    Weight:       215.0 lb Date of Birth:  [birth year]      BSA:          2.130 m? Patient Age:    82 years      BP:           126/80 mmHg Patient Gender: M             HR:           70 bpm. Exam Location:  [address] Procedure: 2D Echo, 3D Echo, Cardiac Doppler and Color Doppler Indications:    I49.3 PVC's  History:        Patient has prior history of Echocardiogram examinations, most                 recent 12/03/2019. Signs/Symptoms:Dizziness/Lightheadedness and                  Shortness of Breath; Risk Factors:Hypertension, Sleep Apnea,                 Former Smoker and Family History of Coronary Artery Disease.                 Obesity, Family History of HOCM, Palpitations.  Sonographer:    Deliah Boston RDCS Referring Phys: Cassie Freer O'NEAL IMPRESSIONS  1. Left ventricular ejection fraction, by estimation, is 60 to 65%. The left ventricle has normal function. The left ventricle has no regional wall motion abnormalities. Left ventricular diastolic parameters were normal.  2. Right ventricular systolic function is normal. The right ventricular size is normal. There is mildly elevated pulmonary artery systolic pressure.  3. Left atrial size was severely dilated.  4. Right atrial size was mildly dilated.  5. The mitral valve is normal in structure. Trivial mitral valve regurgitation. No evidence of mitral stenosis.  6. The aortic valve is tricuspid. Aortic valve regurgitation is not visualized. No aortic stenosis is present. FINDINGS  Left Ventricle: Left ventricular ejection fraction, by estimation, is 60 to 65%. The left ventricle has normal function. The left ventricle has no regional wall motion abnormalities. The left ventricular internal cavity size was normal in size. There is  no concentric left ventricular hypertrophy. Left ventricular diastolic parameters were normal. Right Ventricle: The right ventricular size is normal. Right vetricular wall thickness was not well visualized. Right ventricular systolic function is normal. There is mildly elevated pulmonary artery systolic pressure. The tricuspid regurgitant velocity  is 2.92 m/s, and with an assumed right atrial pressure of 3 mmHg, the estimated right ventricular systolic pressure is 35.3 mmHg. Left Atrium: Left atrial size was severely dilated. Right Atrium: Right atrial size was mildly dilated. Pericardium: There is no evidence of pericardial effusion. Mitral Valve: The mitral valve is normal in structure. Trivial mitral  valve regurgitation. No evidence of mitral valve stenosis. Tricuspid Valve: The tricuspid valve is normal in structure. Tricuspid valve regurgitation is trivial. Aortic Valve: The aortic valve is tricuspid. Aortic valve regurgitation is not visualized. No aortic stenosis is present. Pulmonic Valve: The pulmonic valve was normal in structure. Pulmonic valve regurgitation is not  visualized. Aorta: The aortic root and ascending aorta are structurally normal, with no evidence of dilitation. IAS/Shunts: The atrial septum is grossly normal.  LEFT VENTRICLE PLAX 2D LVIDd:         5.10 cm   Diastology LVIDs:         3.40 cm   LV e' medial:    9.46 cm/s LV PW:         1.00 cm   LV E/e' medial:  12.6 LV IVS:        1.00 cm   LV e' lateral:   13.90 cm/s LVOT diam:     2.40 cm   LV E/e' lateral: 8.6 LV SV:         129 LV SV Index:   60 LVOT Area:     4.52 cm?                           3D Volume EF:                          3D EF:        75 %                          LV EDV:       224 ml                          LV ESV:       56 ml                          LV SV:        167 ml RIGHT VENTRICLE RV S prime:     19.40 cm/s TAPSE (M-mode): 2.2 cm LEFT ATRIUM              Index        RIGHT ATRIUM           Index LA diam:        4.80 cm  2.25 cm/m?   RA Area:     19.70 cm? LA Vol (A2C):   106.0 ml 49.75 ml/m?  RA Volume:   56.20 ml  26.38 ml/m? LA Vol (A4C):   111.0 ml 52.10 ml/m? LA Biplane Vol: 111.0 ml 52.10 ml/m?  AORTIC VALVE LVOT Vmax:   139.00 cm/s LVOT Vmean:  92.033 cm/s LVOT VTI:    0.285 m  AORTA Ao Root diam: 3.40 cm Ao Asc diam:  3.20 cm MITRAL VALVE                TRICUSPID VALVE MV Area (PHT): cm?          TR Peak grad:   34.1 mmHg MV Decel Time: 168 msec     TR Vmax:        292.00 cm/s MV E velocity: 119.50 cm/s MV A velocity: 87.35 cm/s   SHUNTS MV E/A ratio:  1.37         Systemic VTI:  0.28 m                             Systemic Diam: 2.40 cm Mertie Moores MD Electronically signed by Mertie Moores MD Signature  Date/Time: 12/12/2021/1:41:45 PM    Final    ? ?  Physical Exam: ?BP 136/88   Pulse 67   Ht '5\' 8"'$  (1.727 m)   Wt 220 lb (99.8 kg)   SpO2 98%   BMI 33.45 kg/m?  ?Constitutional: Pleasant,well-developed, African-A

## 2022-01-11 ENCOUNTER — Other Ambulatory Visit: Payer: Self-pay | Admitting: Orthopaedic Surgery

## 2022-01-24 ENCOUNTER — Ambulatory Visit: Payer: BC Managed Care – PPO | Admitting: Internal Medicine

## 2022-02-05 ENCOUNTER — Encounter: Payer: Self-pay | Admitting: Gastroenterology

## 2022-02-09 ENCOUNTER — Encounter: Payer: Self-pay | Admitting: Internal Medicine

## 2022-02-12 ENCOUNTER — Encounter: Payer: Self-pay | Admitting: Gastroenterology

## 2022-02-12 ENCOUNTER — Ambulatory Visit (AMBULATORY_SURGERY_CENTER): Payer: BC Managed Care – PPO | Admitting: Gastroenterology

## 2022-02-12 VITALS — BP 143/89 | HR 60 | Temp 98.7°F | Resp 13 | Ht 68.0 in | Wt 220.0 lb

## 2022-02-12 DIAGNOSIS — Z1211 Encounter for screening for malignant neoplasm of colon: Secondary | ICD-10-CM

## 2022-02-12 DIAGNOSIS — K529 Noninfective gastroenteritis and colitis, unspecified: Secondary | ICD-10-CM | POA: Diagnosis not present

## 2022-02-12 DIAGNOSIS — K639 Disease of intestine, unspecified: Secondary | ICD-10-CM

## 2022-02-12 DIAGNOSIS — D120 Benign neoplasm of cecum: Secondary | ICD-10-CM

## 2022-02-12 DIAGNOSIS — D124 Benign neoplasm of descending colon: Secondary | ICD-10-CM | POA: Diagnosis not present

## 2022-02-12 MED ORDER — SODIUM CHLORIDE 0.9 % IV SOLN: 500.0000 mL | Freq: Once | INTRAVENOUS | Status: DC

## 2022-02-12 NOTE — Progress Notes (Signed)
PT taken to PACU. Monitors in place. VSS. Report given to RN. 

## 2022-02-12 NOTE — Progress Notes (Signed)
Called to room to assist during endoscopic procedure.  Patient ID and intended procedure confirmed with present staff. Received instructions for my participation in the procedure from the performing physician.  

## 2022-02-12 NOTE — Progress Notes (Signed)
[facility] History and Physical ? ? ?Primary Care Physician:  Jearld Fenton, NP ? ? ?Reason for Procedure:   Colon cancer screening ? ?Plan:    Screening colonoscopy ? ? ? ? ?HPI: Dylan Webster is a 46 y.o. male undergoing initial average risk screening colonoscopy.  He has no family history of colon cancer and no chronic GI symptoms.  ? ? ?Past Medical History:  ?Diagnosis Date  ? Allergy   ? GERD (gastroesophageal reflux disease)   ? HTN (hypertension)   ? OSA (obstructive sleep apnea)   ? Osteoarthritis of knees, bilateral   ? Sleep apnea   ? uses CPAP  ? ? ?Past Surgical History:  ?Procedure Laterality Date  ? COLONOSCOPY    ? NO PAST SURGERIES    ? ? ?Prior to Admission medications   ?Medication Sig Start Date End Date Taking? Authorizing Provider  ?acetaminophen (TYLENOL) 500 MG tablet Take 500 mg by mouth every 6 (six) hours as needed for mild pain or moderate pain.   Yes [provider]  ?amLODipine (NORVASC) 5 MG tablet Take 1 tablet (5 mg total) by mouth 2 (two) times daily. 11/29/21  Yes Jearld Fenton, NP  ?diclofenac (VOLTAREN) 75 MG EC tablet TAKE 1 TABLET BY MOUTH TWICE A DAY WITH MEALS 01/22/22  Yes Persons, Bevely Palmer, PA  ?omeprazole (PRILOSEC) 20 MG capsule Take 1 capsule (20 mg total) by mouth daily. 11/29/21  Yes Jearld Fenton, NP  ?hydrOXYzine (ATARAX) 10 MG tablet Take 1 tablet (10 mg total) by mouth daily as needed. 11/29/21   Jearld Fenton, NP  ? ? ?Current Outpatient Medications  ?Medication Sig Dispense Refill  ? acetaminophen (TYLENOL) 500 MG tablet Take 500 mg by mouth every 6 (six) hours as needed for mild pain or moderate pain.    ? amLODipine (NORVASC) 5 MG tablet Take 1 tablet (5 mg total) by mouth 2 (two) times daily. 180 tablet 1  ? diclofenac (VOLTAREN) 75 MG EC tablet TAKE 1 TABLET BY MOUTH TWICE A DAY WITH MEALS 60 tablet 0  ? omeprazole (PRILOSEC) 20 MG capsule Take 1 capsule (20 mg total) by mouth daily. 90 capsule 1  ? hydrOXYzine (ATARAX) 10 MG  tablet Take 1 tablet (10 mg total) by mouth daily as needed. 30 tablet 2  ? ?Current Facility-Administered Medications  ?Medication Dose Route Frequency Provider Last Rate Last Admin  ? 0.9 %  sodium chloride infusion  500 mL Intravenous Once Daryel November, MD      ? ? ?Allergies as of 02/12/2022  ? (No Known Allergies)  ? ? ?Family History  ?Problem Relation Age of Onset  ? Alcohol abuse Mother   ? Diabetes Mother   ? Alcohol abuse Father   ? Heart disease Father   ? Hyperlipidemia Maternal Aunt   ? Leukemia Maternal Aunt   ? Heart disease Paternal Uncle   ? Hypertension Paternal Uncle   ? Alcohol abuse Paternal Grandfather   ? Hyperlipidemia Paternal Grandfather   ? Heart disease Paternal Grandfather   ? Hypertension Paternal Grandfather   ? Diabetes Paternal Grandfather   ? Kidney disease Paternal Grandfather   ? Colon cancer Neg Hx   ? Stomach cancer Neg Hx   ? Esophageal cancer Neg Hx   ? Rectal cancer Neg Hx   ? ? ?Social History  ? ?Socioeconomic History  ? Marital status: Married  ?  Spouse name: Not on file  ? Number of children: 4  ?  Years of education: Not on file  ? Highest education level: Not on file  ?Occupational History  ? Occupation: maintenance  ?Tobacco Use  ? Smoking status: Former  ?  Packs/day: 1.00  ?  Years: 12.00  ?  Pack years: 12.00  ?  Types: Cigarettes  ?  Quit date: 12/22/2018  ?  Years since quitting: 3.1  ? Smokeless tobacco: Never  ? Tobacco comments:  ?  quit 12/2018  ?Vaping Use  ? Vaping Use: Never used  ?Substance and Sexual Activity  ? Alcohol use: No  ? Drug use: No  ? Sexual activity: Yes  ?  Birth control/protection: None  ?Other Topics Concern  ? Not on file  ?Social History Narrative  ? ** Merged History Encounter **  ?    ? Married, 4 children ? ?Job: [name] ? ?Will start exercising regularly  ? ?Social Determinants of Health  ? ?Financial Resource Strain: Not on file  ?Food Insecurity: Not on file  ?Transportation Needs: Not on file  ?Physical  Activity: Not on file  ?Stress: Not on file  ?Social Connections: Not on file  ?Intimate Partner Violence: Not on file  ? ? ?Review of Systems: ? ?All other review of systems negative except as mentioned in the HPI. ? ?Physical Exam: ?Vital signs ?BP (!) 147/97   Pulse 66   Temp 98.7 ?F (37.1 ?C) (Temporal)   Ht '5\' 8"'$  (1.727 m)   Wt 220 lb (99.8 kg)   SpO2 99%   BMI 33.45 kg/m?  ? ?General:   Alert,  Well-developed, well-nourished, pleasant and cooperative in NAD ?Airway:  Mallampati 3 ?Lungs:  Clear throughout to auscultation.   ?Heart:  Regular rate and rhythm; no murmurs, clicks, rubs,  or gallops. ?Abdomen:  Soft, nontender and nondistended. Normal bowel sounds.   ?Neuro/Psych:  Normal mood and affect. A and O x 3 ? ? ?Braxtyn Dorff E. Candis Schatz, MD ?[facility] ? ?

## 2022-02-12 NOTE — Op Note (Signed)
[name] ?Patient Name: Dylan Webster ?Procedure Date: 02/12/2022 8:55 AM ?MRN: [id number] ?Endoscopist: Areil Ottey E. Candis Schatz , MD ?Age: 46 ?Referring MD:  ?Date of Birth: 05-19-1976 ?Gender: Male ?Account #: [id number] ?Procedure:                Colonoscopy ?Indications:              Screening for colorectal malignant neoplasm, This  ?                          is the patient's first colonoscopy ?Medicines:                Monitored Anesthesia Care ?Procedure:                Pre-Anesthesia Assessment: ?                          - Prior to the procedure, a History and Physical  ?                          was performed, and patient medications and  ?                          allergies were reviewed. The patient's tolerance of  ?                          previous anesthesia was also reviewed. The risks  ?                          and benefits of the procedure and the sedation  ?                          options and risks were discussed with the patient.  ?                          All questions were answered, and informed consent  ?                          was obtained. Prior Anticoagulants: The patient has  ?                          taken no previous anticoagulant or antiplatelet  ?                          agents. ASA Grade Assessment: II - A patient with  ?                          mild systemic disease. After reviewing the risks  ?                          and benefits, the patient was deemed in  ?                          satisfactory condition to undergo the procedure. ?  After obtaining informed consent, the colonoscope  ?                          was passed under direct vision. Throughout the  ?                          procedure, the patient's blood pressure, pulse, and  ?                          oxygen saturations were monitored continuously. The  ?                          CF [id number] #[id number] was introduced through the anus  ?                          and advanced to the the  terminal ileum, with  ?                          identification of the appendiceal orifice and IC  ?                          valve. The colonoscopy was performed without  ?                          difficulty. The patient tolerated the procedure  ?                          well. The quality of the bowel preparation was  ?                          adequate. The terminal ileum, ileocecal valve,  ?                          appendiceal orifice, and rectum were photographed.  ?                          The bowel preparation used was Sutab via split dose  ?                          instruction. ?Scope In: 9:09:56 AM ?Scope Out: 9:33:34 AM ?Scope Withdrawal Time: 0 hours 21 minutes 31 seconds  ?Total Procedure Duration: 0 hours 23 minutes 38 seconds  ?Findings:                 The perianal and digital rectal examinations were  ?                          normal. Pertinent negatives include normal  ?                          sphincter tone and no palpable rectal lesions. ?                          A 3 mm polyp was found in the cecum. The polyp was  ?  sessile. The polyp was removed with a cold snare.  ?                          Resection and retrieval were complete. Estimated  ?                          blood loss was minimal. ?                          A 4 mm polyp was found in the descending colon. The  ?                          polyp was sessile. The polyp was removed with a  ?                          cold snare. Resection and retrieval were complete.  ?                          Estimated blood loss was minimal. ?                          A few ulcers were found at the ileocecal valve. No  ?                          bleeding was present. The valve appeared prominent  ?                          and bulging. No stigmata of recent bleeding were  ?                          seen. Biopsies were taken with a cold forceps for  ?                          histology. Estimated blood loss was minimal. ?                           A localized area of mucosa in the terminal ileum  ?                          was nodular and irregular with erythema. Biopsies  ?                          were taken with a cold forceps for histology.  ?                          Estimated blood loss was minimal. ?                          The exam was otherwise normal throughout the  ?                          examined colon. ?  Non-bleeding internal hemorrhoids were found during  ?                          retroflexion. The hemorrhoids were Grade I  ?                          (internal hemorrhoids that do not prolapse). ?                          No additional abnormalities were found on  ?                          retroflexion. ?Complications:            No immediate complications. ?Estimated Blood Loss:     Estimated blood loss was minimal. ?Impression:               - One 3 mm polyp in the cecum, removed with a cold  ?                          snare. Resected and retrieved. ?                          - One 4 mm polyp in the descending colon, removed  ?                          with a cold snare. Resected and retrieved. ?                          - Prominent, bulging ileocecal valve with a few  ?                          ulcers. Biopsied. ?                          - Nodular, irregular ileal mucosa. Biopsied. ?                          - Non-bleeding internal hemorrhoids. ?Recommendation:           - Patient has a contact number available for  ?                          emergencies. The signs and symptoms of potential  ?                          delayed complications were discussed with the  ?                          patient. Return to normal activities tomorrow.  ?                          Written discharge instructions were provided to the  ?                          patient. ?                          -  Resume previous diet. ?                          - Continue present medications. ?                          -  Await pathology results. ?                          - Repeat colonoscopy (date not yet determined) for  ?                          surveillance based on pathology results. ?Dylan Webster E. Candis Schatz, MD ?02/12/2022 9:41:58 AM ?This report has been signed electronically. ?

## 2022-02-12 NOTE — Patient Instructions (Signed)
?  Handouts on polyps and hemorrhoids given. ? ? ? ?YOU HAD AN ENDOSCOPIC PROCEDURE TODAY AT [facility]:   Refer to the procedure report that was given to you for any specific questions about what was found during the examination.  If the procedure report does not answer your questions, please call your gastroenterologist to clarify.  If you requested that your care partner not be given the details of your procedure findings, then the procedure report has been included in a sealed envelope for you to review at your convenience later. ? ?YOU SHOULD EXPECT: Some feelings of bloating in the abdomen. Passage of more gas than usual.  Walking can help get rid of the air that was put into your GI tract during the procedure and reduce the bloating. If you had a lower endoscopy (such as a colonoscopy or flexible sigmoidoscopy) you may notice spotting of blood in your stool or on the toilet paper. If you underwent a bowel prep for your procedure, you may not have a normal bowel movement for a few days. ? ?Please Note:  You might notice some irritation and congestion in your nose or some drainage.  This is from the oxygen used during your procedure.  There is no need for concern and it should clear up in a day or so. ? ?SYMPTOMS TO REPORT IMMEDIATELY: ? ?Following lower endoscopy (colonoscopy or flexible sigmoidoscopy): ? Excessive amounts of blood in the stool ? Significant tenderness or worsening of abdominal pains ? Swelling of the abdomen that is new, acute ? Fever of 100?F or higher ? ? ?For urgent or emergent issues, a gastroenterologist can be reached at any hour by calling [phone]. ?Do not use MyChart messaging for urgent concerns.  ? ? ?DIET:  We do recommend a small meal at first, but then you may proceed to your regular diet.  Drink plenty of fluids but you should avoid alcoholic beverages for 24 hours. ? ?ACTIVITY:  You should plan to take it easy for the rest of today and you should NOT  DRIVE or use heavy machinery until tomorrow (because of the sedation medicines used during the test).   ? ?FOLLOW UP: ?Our staff will call the number listed on your records 48-72 hours following your procedure to check on you and address any questions or concerns that you may have regarding the information given to you following your procedure. If we do not reach you, we will leave a message.  We will attempt to reach you two times.  During this call, we will ask if you have developed any symptoms of COVID 19. If you develop any symptoms (ie: fever, flu-like symptoms, shortness of breath, cough etc.) before then, please call [phone].  If you test positive for Covid 19 in the 2 weeks post procedure, please call and report this information to [place].   ? ?If any biopsies were taken you will be contacted by phone or by letter within the next 1-3 weeks.  Please call us at [phone] if you have not heard about the biopsies in 3 weeks.  ? ? ?SIGNATURES/CONFIDENTIALITY: ?You and/or your care partner have signed paperwork which will be entered into your electronic medical record.  These signatures attest to the fact that that the information above on your After Visit Summary has been reviewed and is understood.  Full responsibility of the confidentiality of this discharge information lies with you and/or your care-partner.  ?

## 2022-02-21 ENCOUNTER — Encounter: Payer: Self-pay | Admitting: Gastroenterology

## 2022-02-21 NOTE — Progress Notes (Signed)
Dylan Webster,  ?One polyp which I removed during your recent procedure was proven to be completely benign but is considered a "pre-cancerous" polyp that MAY have grown into cancer if it had not been removed.  Studies shows that at least 20% of women over age 45 and 30% of men over age 62 have pre-cancerous polyps.  Based on current nationally recognized surveillance guidelines, I recommend that you have a repeat colonoscopy for colon cancer screening in 7 years.  ? ?The biopsies of your terminal ileum (end of your small intestine) were unremarkable.  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  These changes can be associated with Crohn's disease.  They can also be attributed to medication side effect.  Medications that can cause this include most over-the-counter pain medicines and arthritis medicines. ?If you are not experiencing any chronic GI symptoms such as abdominal pain, diarrhea, nausea/vomiting, it is less likely that you have Crohn's disease. ?I would recommend you avoid pain medications other than acetaminophen (Tylenol) which is about the only pain medicine that cannot cause inflammation and ulceration of the GI tract. ?Given the concern for Crohn's disease, it may be advisable to repeat a colonoscopy later this year to see if the abnormalities are still persistent. ?I attempted to call you on your cell phone today, but your voicemail was full. ? ?I would recommend you make a follow-up clinic visit to further discuss the endoscopic findings, biopsy results and potential for repeat colonoscopy. ? ?Vaughan Basta, can you please book a follow-up appointment for Dylan Webster? ? ? ? ?

## 2022-03-01 ENCOUNTER — Other Ambulatory Visit: Payer: Self-pay | Admitting: Physician Assistant

## 2022-03-08 ENCOUNTER — Encounter: Payer: Self-pay | Admitting: Internal Medicine

## 2022-03-08 ENCOUNTER — Encounter (HOSPITAL_BASED_OUTPATIENT_CLINIC_OR_DEPARTMENT_OTHER): Payer: Self-pay | Admitting: Internal Medicine

## 2022-03-08 ENCOUNTER — Ambulatory Visit: Payer: BC Managed Care – PPO | Admitting: Internal Medicine

## 2022-03-08 ENCOUNTER — Telehealth: Payer: Self-pay | Admitting: Internal Medicine

## 2022-03-08 DIAGNOSIS — K219 Gastro-esophageal reflux disease without esophagitis: Secondary | ICD-10-CM

## 2022-03-08 DIAGNOSIS — G4733 Obstructive sleep apnea (adult) (pediatric): Secondary | ICD-10-CM | POA: Diagnosis not present

## 2022-03-08 NOTE — Assessment & Plan Note (Signed)
Benefits from CPAP with improved daytime sleepiness.  Needs help for mask comfort and we need download information. Plan-we are working on download capability for his machine.  Referral for mask fitting.

## 2022-03-08 NOTE — Progress Notes (Signed)
HPI M former smoker followed for OSA, complicated by HBP, Hypercholesterolemia,  NPSG 04/01/2019- AHI 93.3/ hr, desaturation to 75%, body weight 244 lbs  -------------------------------------------------------------------------------------------   01/24/21- 45 yoM former smoker followed for OSA, complicated by HBP, Hypercholesterolemia,  CPAP auto 10-18/ [facility] [phone]) Download- pending for current machine Body weight today-230 lbs Covid vax-3 Moderna Flu vax-had  Requested DME change to [facility] on 11/02/20 phone call -----Patient is doing good overall, no concerns. Recently got new machine and is getting used to it.  Respironics machine was recalled. Now has ResMed through a [employer] although he lives in [place], moving to [place].  Would like higher minimum pressure-discussed. Denies other significant concerns- mentions sore knees.   03/09/22- 7 yoM former smoker followed for OSA, complicated by HTN, GERD, Obesity, Hypercholesterolemia, Osteoarthritis,  CPAP auto 10-18/ [facility] [phone]) Download-  Body weight today- Covid vax-3 Moderna Flu vax-had -----Patient would like to talk about CPAP mask states that sometimes it leaks and then if he tightens it it hurts the back of his head. His DME company needs the machine serial number so they can install a card or download software and we will try to expedite that.  He is well pleased with his CPAP except for mask comfort.  Daytime sleepiness is markedly improved and he feels safe driving. Getting help for GERD and for osteoarthritis in his knees, blood pressure but otherwise no heart or lung problems.  ROS-see HPI   + = positive Constitutional:    weight loss, night sweats, fevers, chills,+ fatigue, lassitude. HEENT:    headaches, difficulty swallowing, tooth/dental problems, sore throat,       sneezing, itching, ear ache, nasal congestion, post nasal drip, snoring CV:    chest pain,  orthopnea, PND, swelling in lower extremities, anasarca,                                   dizziness, palpitations Resp:   shortness of breath with exertion or at rest.                productive cough,   non-productive cough, coughing up of blood.              change in color of mucus.  wheezing.   Skin:    rash or lesions. GI:  + heartburn, indigestion, abdominal pain, nausea, vomiting, diarrhea,                 change in bowel habits, loss of appetite GU: dysuria, change in color of urine, no urgency or frequency.   flank pain. MS:  + joint pain, stiffness, decreased range of motion, back pain. Neuro-     nothing unusual Psych:  change in mood or affect.  depression or anxiety.   memory loss.  OBJ- Physical Exam General- Alert, Oriented, Affect-appropriate, Distress- none acute, + overweight Skin- rash-none, lesions- none, excoriation- none Lymphadenopathy- none Head- atraumatic            Eyes- Gross vision intact, PERRLA, conjunctivae and secretions clear            Ears- Hearing, canals-normal            Nose- Clear, no-Septal dev, mucus, polyps, erosion, perforation             Throat- Mallampati IV, mucosa clear , drainage- none, tonsils- atrophic, + teeth Neck- flexible , trachea midline,  no stridor , thyroid nl, carotid no bruit Chest - symmetrical excursion , unlabored           Heart/CV- RRR , no murmur , no gallop  , no rub, nl s1 s2                           - JVD- none , edema- none, stasis changes- none, varices- none           Lung- clear to P&A, wheeze- none, cough- none , dullness-none, rub- none           Chest wall-  Abd-  Br/ Gen/ Rectal- Not done, not indicated Extrem- cyanosis- none, clubbing, none, atrophy- none, strength- nl Neuro- grossly intact to observation

## 2022-03-08 NOTE — Assessment & Plan Note (Signed)
Had endoscopy

## 2022-03-08 NOTE — Patient Instructions (Signed)
Order- schedule mask fitting at sleep center  Please let us know if we can help

## 2022-03-09 NOTE — Telephone Encounter (Signed)
Called and spoke with the pt  He says Dr Annamaria Boots wanted him to get his serial number off CPAP so we could get DL  I called [facility] and was advised that with the machine he has, he would need to mail them his SD card  They can then print DL and fax to [place]  I spoke with the pt and notified him of this and he verbalized understanding  Nothing further needed at this time

## 2022-03-20 ENCOUNTER — Ambulatory Visit: Payer: BC Managed Care – PPO | Admitting: Gastroenterology

## 2022-03-28 ENCOUNTER — Encounter: Payer: Self-pay | Admitting: Cardiovascular Disease

## 2022-04-07 ENCOUNTER — Other Ambulatory Visit: Payer: Self-pay | Admitting: Physician Assistant

## 2022-05-01 ENCOUNTER — Other Ambulatory Visit (HOSPITAL_BASED_OUTPATIENT_CLINIC_OR_DEPARTMENT_OTHER): Payer: BC Managed Care – PPO | Admitting: Internal Medicine

## 2022-05-04 ENCOUNTER — Other Ambulatory Visit (INDEPENDENT_AMBULATORY_CARE_PROVIDER_SITE_OTHER): Payer: BC Managed Care – PPO

## 2022-05-04 ENCOUNTER — Encounter: Payer: Self-pay | Admitting: Gastroenterology

## 2022-05-04 ENCOUNTER — Ambulatory Visit: Payer: BC Managed Care – PPO | Admitting: Gastroenterology

## 2022-05-04 VITALS — BP 142/82 | HR 75 | Ht 69.0 in | Wt 209.0 lb

## 2022-05-04 DIAGNOSIS — K529 Noninfective gastroenteritis and colitis, unspecified: Secondary | ICD-10-CM | POA: Diagnosis not present

## 2022-05-04 LAB — FOLATE: Folate: 9.3 ng/mL (ref >5.9)

## 2022-05-04 LAB — VITAMIN B12: Vitamin B-12: 239 pg/mL (ref 211–911)

## 2022-05-04 LAB — IBC + FERRITIN
Ferritin: 132.5 ng/mL (ref 22.0–322.0)
Iron: 70 ug/dL (ref 42–165)
Saturation Ratios: 22.8 % (ref 20.0–50.0)
TIBC: 306.6 ug/dL (ref 250.0–450.0)
Transferrin: 219 mg/dL (ref 212.0–360.0)

## 2022-05-04 LAB — C-REACTIVE PROTEIN: CRP: <1 mg/dL (ref 0.5–20.0)

## 2022-05-04 NOTE — Patient Instructions (Signed)
If you are age 45 or older, your body mass index should be between 23-30. Your Body mass index is 30.86 kg/m. If this is out of the aforementioned range listed, please consider follow up with your Primary Care Provider.  If you are age 16 or younger, your body mass index should be between 19-25. Your Body mass index is 30.86 kg/m. If this is out of the aformentioned range listed, please consider follow up with your Primary Care Provider.   Your provider has requested that you go to the basement level for lab work before leaving today. Press "B" on the elevator. The lab is located at the first door on the left as you exit the elevator.   The [facility] providers would like to encourage you to use [facility] to communicate with providers for non-urgent requests or questions.  Due to long hold times on the telephone, sending your provider a message by [facility] may be a faster and more efficient way to get a response.  Please allow 48 business hours for a response.  Please remember that this is for non-urgent requests.   Due to recent changes in healthcare laws, you may see the results of your imaging and laboratory studies on MyChart before your provider has had a chance to review them.  We understand that in some cases there may be results that are confusing or concerning to you. Not all laboratory results come back in the same time frame and the provider may be waiting for multiple results in order to interpret others.  Please give [place] 48 hours in order for your provider to thoroughly review all the results before contacting the office for clarification of your results.   It was a pleasure to see you today!  Thank you for trusting me with your gastrointestinal care!    Scott E. Candis Schatz, MD

## 2022-05-04 NOTE — Progress Notes (Signed)
HPI : Dylan Webster is a very pleasant 46 year old male with a history of GERD, obstructive sleep apnea, hypertension and osteoarthritis who presents to our clinic today for follow-up after his initial average rescreening colonoscopy.  He underwent colonoscopy on May 8 and was found to have a single small tubular adenoma in the cecum.  His ileocecal valve was abnormal, characterized by a bulging appearance with multiple small ulcerations.  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  Definitive features of Crohn's disease were not present.  The terminal ileal mucosa also appeared abnormal, although there is no ulceration present.  Biopsies of the terminal ileum were unremarkable.  It was felt that his ileitis was most likely related to NSAID use, although Crohn's disease remains possible. The patient reports taking frequent and regular NSAIDs for his knee pain for the past year.  Typically he would take 800 mg of Motrin as well as Voltaren on a daily basis. Following his colonoscopy, it was recommended that he stop taking oral NSAIDs.  However, he is found that Tylenol does not adequately control his pain, and neither does topical Voltaren.  He has significantly decreased NSAID usage, but has not been able to completely stop.  He does not take Motrin anymore, but does take oral Voltaren a few times a week.  Today, the patient denies any GI symptoms other than occasional diarrhea associated with certain foods (specifically mayonnaise and scrambled eggs).  Most days, he has regular formed stools.  Abdominal pain is not a problem for him.  No problems with nausea or vomiting.  He has lost about 10 pounds over the past few months through improving his diet (decreasing portion size and snacking). He was noted to have a decrease in his hemoglobin earlier this year.  His hemoglobin was 12 down from a baseline of 13-14.  He denies any prior history of anemia.  He denies any overt GI  bleeding.  Other than the bilateral knee pain, he denies any other arthralgias.  No history of other possible EIMs (uveitis/iritis, PG, EN)  Past Medical History:  Diagnosis Date   Allergy    GERD (gastroesophageal reflux disease)    HTN (hypertension)    OSA (obstructive sleep apnea)    Osteoarthritis of knees, bilateral    Sleep apnea    uses CPAP     Past Surgical History:  Procedure Laterality Date   COLONOSCOPY     NO PAST SURGERIES     Family History  Problem Relation Age of Onset   Alcohol abuse Mother    Diabetes Mother    Alcohol abuse Father    Heart disease Father    Hyperlipidemia Maternal Aunt    Leukemia Maternal Aunt    Heart disease Paternal Uncle    Hypertension Paternal Uncle    Alcohol abuse Paternal Grandfather    Hyperlipidemia Paternal Grandfather    Heart disease Paternal Grandfather    Hypertension Paternal Grandfather    Diabetes Paternal Grandfather    Kidney disease Paternal Grandfather    Colon cancer Neg Hx    Stomach cancer Neg Hx    Esophageal cancer Neg Hx    Rectal cancer Neg Hx    Social History   Tobacco Use   Smoking status: Former    Packs/day: 1.00    Years: 12.00    Total pack years: 12.00    Types: Cigarettes    Quit date: 12/22/2018    Years since quitting: 3.3   Smokeless tobacco:  Never   Tobacco comments:    quit 12/2018  Vaping Use   Vaping Use: Never used  Substance Use Topics   Alcohol use: No   Drug use: No   Current Outpatient Medications  Medication Sig Dispense Refill   acetaminophen (TYLENOL) 500 MG tablet Take 500 mg by mouth every 6 (six) hours as needed for mild pain or moderate pain.     amLODipine (NORVASC) 5 MG tablet Take 1 tablet (5 mg total) by mouth 2 (two) times daily. 180 tablet 1   diclofenac (VOLTAREN) 75 MG EC tablet TAKE 1 TABLET BY MOUTH TWICE A DAY WITH MEALS 60 tablet 0   hydrOXYzine (ATARAX) 10 MG tablet Take 1 tablet (10 mg total) by mouth daily as needed. 30 tablet 2   omeprazole  (PRILOSEC) 20 MG capsule Take 1 capsule (20 mg total) by mouth daily. 90 capsule 1   No current facility-administered medications for this visit.   No Known Allergies   Review of Systems: All systems reviewed and negative except where noted in HPI.    No results found.  Physical Exam: BP (!) 142/82   Pulse 75   Ht '5\' 9"'$  (1.753 m)   Wt 209 lb (94.8 kg)   SpO2 98%   BMI 30.86 kg/m  Constitutional: Pleasant,well-developed, African-American male in no acute distress.   HEENT: Normocephalic and atraumatic. Conjunctivae are normal. No scleral icterus. Cardiovascular: Normal rate, regular rhythm.  Pulmonary/chest: Effort normal and breath sounds normal. No wheezing, rales or rhonchi. Abdominal: Soft, nondistended, nontender. Bowel sounds active throughout. There are no masses palpable. No hepatomegaly. Extremities: no edema Neurological: Alert and oriented to person place and time.  Antalgic gait Skin: Skin is warm and dry. No rashes noted. Psychiatric: Normal mood and affect. Behavior is normal.  CBC    Component Value Date/Time   WBC 7.4 11/18/2021 0237   RBC 3.77 (L) 11/18/2021 0237   HGB 12.3 (L) 11/18/2021 0237   HCT 35.4 (L) 11/18/2021 0237   PLT 238 11/18/2021 0237   MCV 93.9 11/18/2021 0237   MCH 32.6 11/18/2021 0237   MCHC 34.7 11/18/2021 0237   RDW 12.1 11/18/2021 0237   LYMPHSABS 3.8 04/07/2021 0238   MONOABS 0.7 04/07/2021 0238   EOSABS 0.3 04/07/2021 0238   BASOSABS 0.0 04/07/2021 0238    CMP     Component Value Date/Time   NA 138 11/18/2021 0237   NA 141 12/30/2019 1423   K 3.6 11/18/2021 0237   CL 107 11/18/2021 0237   CO2 23 11/18/2021 0237   GLUCOSE 89 11/18/2021 0237   BUN 15 11/18/2021 0237   BUN 16 12/30/2019 1423   CREATININE 0.83 11/18/2021 0237   CALCIUM 9.1 11/18/2021 0237   PROT 7.1 06/09/2020 1553   ALBUMIN 4.6 06/09/2020 1553   AST 17 06/09/2020 1553   ALT 19 06/09/2020 1553   ALKPHOS 83 06/09/2020 1553   BILITOT 1.0 06/09/2020  1553   GFRNONAA >60 11/18/2021 0237   GFRAA 128 12/30/2019 1423     ASSESSMENT AND PLAN: 46 year old male incidentally found to have prominent, bulging ileocecal valve with superficial ulcerations, with biopsy showing chronic active ileitis.  He denies any chronic lower GI symptoms to suggest Crohn's disease.  He is a frequent user of NSAIDs (Motrin and diclofenac daily).  I suspect that the inflammation in the ileocecal valve is NSAID related, but preclinical Crohn's disease is also possible.  He does have a mild anemia, which could be from chronic blood loss  related to NSAID gastroenteropathy versus Crohn's disease.  I recommended we obtain IBD serologies, iron and B12 levels and check a CRP. If any of these lab test are abnormal, then I think a repeat colonoscopy +/- EGD within the next 6 months is indicated to reevaluate for Crohn's disease. If all of these tests are normal, and the patient continues to deny any chronic GI symptoms, I do not know that repeat endoscopy is necessary. The patient is quite dependent on NSAIDs for controlling his arthritis pain.  He is going to seek a second opinion on the role of surgery for his arthritic knee pain.  He will continue to try and limit his NSAID use, relying on acetaminophen and topical diclofenac.  He should also consider injections (steroids or Synvisc) to limit NSAID use.  Ileitis, NSAID-induced versus Crohn's -IBD panel -Iron panel -CRP -B12  -If any labs abnormal, then repeat colonoscopy +/- within 6-12 months to reassess for possible Crohn's -Stop NSAIDs; pt to seek second opinion on knee surgery  Tubular adenoma - Patient will be due for surveillance colonoscopy in 7 years  Terance Pomplun E. Candis Schatz, MD [facility]   Garnette Gunner Coralie Keens, NP

## 2022-05-14 LAB — IBD EXPANDED PANEL
ACCA: 25 units (ref 0–90)
ALCA: 9 units (ref 0–60)
AMCA: 57 units (ref 0–100)
Atypical pANCA: NEGATIVE
gASCA: 17 units (ref 0–50)

## 2022-05-20 NOTE — Progress Notes (Signed)
Dylan Webster,  All of your labs looked good, and were not suggestive of underlying Crohn's disease.  Please make efforts to get off the NSAIDs (arthritis meds) and let us know if you develop any GI symptoms such as persistent abdominal pain or diarrhea.

## 2022-06-05 ENCOUNTER — Ambulatory Visit: Payer: BC Managed Care – PPO | Admitting: Internal Medicine

## 2022-06-05 ENCOUNTER — Ambulatory Visit (HOSPITAL_BASED_OUTPATIENT_CLINIC_OR_DEPARTMENT_OTHER): Payer: BC Managed Care – PPO | Attending: Internal Medicine | Admitting: Internal Medicine

## 2022-06-05 ENCOUNTER — Encounter: Payer: Self-pay | Admitting: Internal Medicine

## 2022-06-05 VITALS — BP 152/94 | HR 73 | Temp 96.9°F | Wt 208.0 lb

## 2022-06-05 DIAGNOSIS — I10 Essential (primary) hypertension: Secondary | ICD-10-CM | POA: Diagnosis not present

## 2022-06-05 DIAGNOSIS — E7800 Pure hypercholesterolemia, unspecified: Secondary | ICD-10-CM

## 2022-06-05 DIAGNOSIS — Z6830 Body mass index (BMI) 30.0-30.9, adult: Secondary | ICD-10-CM

## 2022-06-05 DIAGNOSIS — E6609 Other obesity due to excess calories: Secondary | ICD-10-CM

## 2022-06-05 DIAGNOSIS — F419 Anxiety disorder, unspecified: Secondary | ICD-10-CM

## 2022-06-05 DIAGNOSIS — M170 Bilateral primary osteoarthritis of knee: Secondary | ICD-10-CM

## 2022-06-05 DIAGNOSIS — G4733 Obstructive sleep apnea (adult) (pediatric): Secondary | ICD-10-CM | POA: Diagnosis not present

## 2022-06-05 DIAGNOSIS — Z23 Encounter for immunization: Secondary | ICD-10-CM

## 2022-06-05 DIAGNOSIS — K219 Gastro-esophageal reflux disease without esophagitis: Secondary | ICD-10-CM

## 2022-06-05 MED ORDER — AMLODIPINE BESYLATE 5 MG PO TABS: 5.0000 mg | ORAL_TABLET | Freq: Two times a day (BID) | ORAL | 1 refills | Status: DC

## 2022-06-05 MED ORDER — HYDROXYZINE HCL 10 MG PO TABS
10.0000 mg | ORAL_TABLET | Freq: Every day | ORAL | 0 refills | Status: DC | PRN
Start: 2022-06-05 — End: 2024-02-18

## 2022-06-05 NOTE — Assessment & Plan Note (Signed)
He has not been taking medication as prescribed Advised him to start taking 5 mg BID Reinforced DASH diet and exercise for weight loss CMET today  RTC in 2 weeks for BP follow up

## 2022-06-05 NOTE — Assessment & Plan Note (Signed)
Avoid foods that trigger your reflux Encouraged weight loss as this can help reduce sleep apnea symptoms Continue Omeprazole

## 2022-06-05 NOTE — Assessment & Plan Note (Signed)
Continue Hydroxyzine prn Support offered

## 2022-06-05 NOTE — Assessment & Plan Note (Signed)
Encouraged weight loss as this can help reduce knee pain Limit use of Diclofenac, use Tylenol instead

## 2022-06-05 NOTE — Progress Notes (Signed)
Subjective:    Patient ID: Dylan Webster, male    DOB: [birth year], 46 y.o.   MRN: [id number]  HPI  Patient presents to clinic today for 87-monthfollow-up of chronic conditions.  HTN: His BP today is 148/94.  He is not taking Amlodipine as prescribed, only taking it 1 x day.  ECG from 11/2021 reviewed.  OSA: He averages 6 hours per sleep with the use of his CPAP.  Sleep study from 03/2019 reviewed.  Anxiety: Intermittent.  He takes Hydroxyzine as needed with good relief of symptoms.  He is not currently seeing a therapist.  He denies depression, SI/HI.  OA: Mainly in his knees.  He takes Diclofenac as needed with good relief of symptoms.  He follows with orthopedics.  Anemia: His last H/H was 12.3, 35.4, 11/2021.  He is not taking any oral Iron at this time.  He does not follow with hematology.  GERD: Triggered by red meat and laying down after eating.  He denies breakthrough on Omeprazole.  There is no upper GI on file.  HLD: His last LDL was 107, triglycerides 69, 11/2021.  He is not taking any cholesterol-lowering medication at this time.  He does not check his sugars.  Review of Systems     Past Medical History:  Diagnosis Date   Allergy    GERD (gastroesophageal reflux disease)    HTN (hypertension)    OSA (obstructive sleep apnea)    Osteoarthritis of knees, bilateral    Sleep apnea    uses CPAP    Current Outpatient Medications  Medication Sig Dispense Refill   acetaminophen (TYLENOL) 500 MG tablet Take 500 mg by mouth every 6 (six) hours as needed for mild pain or moderate pain.     amLODipine (NORVASC) 5 MG tablet Take 1 tablet (5 mg total) by mouth 2 (two) times daily. 180 tablet 1   diclofenac (VOLTAREN) 75 MG EC tablet TAKE 1 TABLET BY MOUTH TWICE A DAY WITH MEALS 60 tablet 0   hydrOXYzine (ATARAX) 10 MG tablet Take 1 tablet (10 mg total) by mouth daily as needed. 30 tablet 2   omeprazole (PRILOSEC) 20 MG capsule Take 1 capsule (20 mg total) by mouth daily. 90  capsule 1   No current facility-administered medications for this visit.    No Known Allergies  Family History  Problem Relation Age of Onset   Alcohol abuse Mother    Diabetes Mother    Alcohol abuse Father    Heart disease Father    Hyperlipidemia Maternal Aunt    Leukemia Maternal Aunt    Heart disease Paternal Uncle    Hypertension Paternal Uncle    Alcohol abuse Paternal Grandfather    Hyperlipidemia Paternal Grandfather    Heart disease Paternal Grandfather    Hypertension Paternal Grandfather    Diabetes Paternal Grandfather    Kidney disease Paternal Grandfather    Colon cancer Neg Hx    Stomach cancer Neg Hx    Esophageal cancer Neg Hx    Rectal cancer Neg Hx     Social History   Socioeconomic History   Marital status: Married    Spouse name: Not on file   Number of children: 4   Years of education: Not on file   Highest education level: Not on file  Occupational History   Occupation: maintenance  Tobacco Use   Smoking status: Former    Packs/day: 1.00    Years: 12.00    Total pack years: 12.00  Types: Cigarettes    Quit date: 12/22/2018    Years since quitting: 3.4   Smokeless tobacco: Never   Tobacco comments:    quit 12/2018  Vaping Use   Vaping Use: Never used  Substance and Sexual Activity   Alcohol use: No   Drug use: No   Sexual activity: Yes    Birth control/protection: None  Other Topics Concern   Not on file  Social History Narrative   ** Merged History Encounter **       Married, 4 children  Job: [employer]  Will start exercising regularly   Social Determinants of Health   Financial Resource Strain: Not on file  Food Insecurity: Not on file  Transportation Needs: Not on file  Physical Activity: Not on file  Stress: Not on file  Social Connections: Not on file  Intimate Partner Violence: Not on file     Constitutional: Denies fever, malaise, fatigue, headache or abrupt weight changes.  HEENT: Denies eye  pain, eye redness, ear pain, ringing in the ears, wax buildup, runny nose, nasal congestion, bloody nose, or sore throat. Respiratory: Denies difficulty breathing, shortness of breath, cough or sputum production.   Cardiovascular: Denies chest pain, chest tightness, palpitations or swelling in the hands or feet.  Gastrointestinal: Denies abdominal pain, bloating, constipation, diarrhea or blood in the stool.  GU: Denies urgency, frequency, pain with urination, burning sensation, blood in urine, odor or discharge. Musculoskeletal: Patient reports knee pain.  Denies decrease in range of motion, difficulty with gait, muscle pain or joint swelling.  Skin: Denies redness, rashes, lesions or ulcercations.  Neurological: Denies dizziness, difficulty with memory, difficulty with speech or problems with balance and coordination.  Psych: Patient has a history of anxiety.  Denies depression, SI/HI.  No other specific complaints in a complete review of systems (except as listed in HPI above).  Objective:   Physical Exam   BP (!) 152/94 (BP Location: Right Arm, Patient Position: Sitting, Cuff Size: Large)   Pulse 73   Temp (!) 96.9 F (36.1 C) (Temporal)   Wt 208 lb (94.3 kg)   SpO2 100%   BMI 30.72 kg/m  Wt Readings from Last 3 Encounters:  06/05/22 208 lb (94.3 kg)  05/04/22 209 lb (94.8 kg)  03/08/22 210 lb 9.6 oz (95.5 kg)    General: Appears his stated age, obese, in NAD. Skin: Warm, dry and intact. HEENT: Head: normal shape and size; Eyes: sclera white, no icterus, conjunctiva pink, PERRLA and EOMs intact; Cardiovascular: Normal rate and rhythm. S1,S2 noted.  No murmur, rubs or gallops noted. No JVD or BLE edema.  Pulmonary/Chest: Normal effort and positive vesicular breath sounds. No respiratory distress. No wheezes, rales or ronchi noted.  Abdomen: Soft and nontender. Normal bowel sounds.  Musculoskeletal: No difficulty with gait.  Neurological: Alert and oriented.    BMET     Component Value Date/Time   NA 138 11/18/2021 0237   NA 141 12/30/2019 1423   K 3.6 11/18/2021 0237   CL 107 11/18/2021 0237   CO2 23 11/18/2021 0237   GLUCOSE 89 11/18/2021 0237   BUN 15 11/18/2021 0237   BUN 16 12/30/2019 1423   CREATININE 0.83 11/18/2021 0237   CALCIUM 9.1 11/18/2021 0237   GFRNONAA >60 11/18/2021 0237   GFRAA 128 12/30/2019 1423    Lipid Panel     Component Value Date/Time   CHOL 165 11/29/2021 0908   TRIG 69 11/29/2021 0908   HDL 42 11/29/2021  0908   CHOLHDL 3.9 11/29/2021 0908   VLDL 19.4 06/09/2020 1553   LDLCALC 107 (H) 11/29/2021 0908    CBC    Component Value Date/Time   WBC 7.4 11/18/2021 0237   RBC 3.77 (L) 11/18/2021 0237   HGB 12.3 (L) 11/18/2021 0237   HCT 35.4 (L) 11/18/2021 0237   PLT 238 11/18/2021 0237   MCV 93.9 11/18/2021 0237   MCH 32.6 11/18/2021 0237   MCHC 34.7 11/18/2021 0237   RDW 12.1 11/18/2021 0237   LYMPHSABS 3.8 04/07/2021 0238   MONOABS 0.7 04/07/2021 0238   EOSABS 0.3 04/07/2021 0238   BASOSABS 0.0 04/07/2021 0238    Hgb A1C Lab Results  Component Value Date   HGBA1C 4.7 11/29/2021           Assessment & Plan:   RTC in 2 weeks for BP follow up, 6 months, for your annual exam Webb Silversmith, NP

## 2022-06-05 NOTE — Assessment & Plan Note (Signed)
CMET and lipid profile today Encouraged him to consume a low fat diet 

## 2022-06-05 NOTE — Assessment & Plan Note (Signed)
Encouraged weight loss as this can help reduce sleep apnea symptoms Continue CPAP

## 2022-06-05 NOTE — Patient Instructions (Signed)

## 2022-06-05 NOTE — Assessment & Plan Note (Signed)
Encouraged diet and exercise for weight loss ?

## 2022-06-06 LAB — COMPLETE METABOLIC PANEL WITH GFR
AG Ratio: 1.6 (calc) (ref 1.0–2.5)
ALT: 9 U/L (ref 9–46)
AST: 13 U/L (ref 10–40)
Albumin: 4.6 g/dL (ref 3.6–5.1)
Alkaline phosphatase (APISO): 90 U/L (ref 36–130)
BUN: 20 mg/dL (ref 7–25)
CO2: 26 mmol/L (ref 20–32)
Calcium: 9.7 mg/dL (ref 8.6–10.3)
Chloride: 107 mmol/L (ref 98–110)
Creat: 0.8 mg/dL (ref 0.60–1.29)
Globulin: 2.9 g/dL (calc) (ref 1.9–3.7)
Glucose, Bld: 95 mg/dL (ref 65–99)
Potassium: 3.9 mmol/L (ref 3.5–5.3)
Sodium: 140 mmol/L (ref 135–146)
Total Bilirubin: 0.8 mg/dL (ref 0.2–1.2)
Total Protein: 7.5 g/dL (ref 6.1–8.1)
eGFR: 111 mL/min/{1.73_m2} (ref >=60)

## 2022-06-06 LAB — LIPID PANEL
Cholesterol: 151 mg/dL (ref <200)
HDL: 50 mg/dL (ref >=40)
LDL Cholesterol (Calc): 85 mg/dL (calc)
Non-HDL Cholesterol (Calc): 101 mg/dL (calc) (ref <130)
Total CHOL/HDL Ratio: 3 (calc) (ref <5.0)
Triglycerides: 73 mg/dL (ref <150)

## 2022-06-15 ENCOUNTER — Telehealth: Payer: Self-pay | Admitting: Pharmacist

## 2022-06-15 NOTE — Telephone Encounter (Signed)
Chronic Care Management   Outreach Note  06/15/2022 Name: Dylan Webster MRN: [id number] DOB: Feb 07, 1976  I connected with [name] on 06/15/22 by telephone outreach and verified that I am speaking with the correct person using two identifiers.  Patient appearing on report for [place] Metric Hypertension Control due to last documented ambulatory blood pressure of 152/94 on 06/05/2022. Next appointment with PCP is 06/19/2022   Outreached patient to discuss hypertension control and medication management.   Outpatient Encounter Medications as of 06/15/2022  Medication Sig   amLODipine (NORVASC) 5 MG tablet Take 1 tablet (5 mg total) by mouth 2 (two) times daily. (Patient taking differently: Take 10 mg by mouth daily.)   diclofenac Sodium (VOLTAREN ARTHRITIS PAIN) 1 % GEL Apply topically 4 (four) times daily as needed.   acetaminophen (TYLENOL) 500 MG tablet Take 500 mg by mouth every 6 (six) hours as needed for mild pain or moderate pain.   diclofenac (VOLTAREN) 75 MG EC tablet TAKE 1 TABLET BY MOUTH TWICE A DAY WITH MEALS (Patient taking differently: TAKE 1 TABLET BY MOUTH TWICE A DAY WITH MEALS, as needed)   hydrOXYzine (ATARAX) 10 MG tablet Take 1 tablet (10 mg total) by mouth daily as needed.   omeprazole (PRILOSEC) 20 MG capsule Take 1 capsule (20 mg total) by mouth daily.   No facility-administered encounter medications on file as of 06/15/2022.    Lab Results  Component Value Date   CREATININE 0.80 06/05/2022   BUN 20 06/05/2022   NA 140 06/05/2022   K 3.9 06/05/2022   CL 107 06/05/2022   CO2 26 06/05/2022    BP Readings from Last 3 Encounters:  06/05/22 (!) 152/94  05/04/22 (!) 142/82  03/08/22 140/80    Pulse Readings from Last 3 Encounters:  06/05/22 73  05/04/22 75  03/08/22 84    Current medications: amlodipine 5 mg - 2 tablets (10 mg) each morning (starting on 9/6) Reports previously taking just amlodipine 5 mg -1 tablet daily. Note at Office Visit with PCP on  8/29, provider advised patient to start taking amlodipine 5 mg BID Today patient reports after provider visit accidentally started taking hydroxyzine, rather than amlodipine twice daily. However, reports as of 9/6, now taking amlodipine 5 mg - 2 tablets (10 mg) each day as directed.  Reports using weekly pillbox to aid with adherence  Reports limiting NSAID use. Using OTC Voltaren gel to joints as needed, but no longer using OTC ibuprofen and using diclofenac tablets only rarely if needed in place of Voltaren gel   Home Monitoring: Patient has an automated upper arm home BP machine Denies checking home blood pressure recently  Denies adding salt to his food, but reports meals typically prepared by family Discuss with patient importance of limiting salt/sodium intake Encourage patient to review nutrition labels for sodium content Reports will let family who prepares his meals about need to reduce his salt intake  Exercise: reports is up and down throughout the day lifting at work.  Reports uses CPAP consistently whenever he sleeps   Assessment/Plan: - Currently uncontrolled based on latest in office reading - Reviewed goal blood pressure <130/80 - Reviewed appropriate administration of medication regimen - Counseled on long term microvascular and macrovascular complications of uncontrolled hypertension - Reviewed to check blood pressure, document, and provide at next provider visit - Discussed dietary modifications, such as reduced salt intake, focus on whole grains, vegetables, lean proteins - Discussed goal of 150 minutes of moderate intensity  physical activity weekly - Patient to attend upcoming appointment with PCP as scheduled on 9/12. Remind patient to take his blood pressure medication before coming to any office visit, even if asked to fast for labs.    Follow Up Plan: CM Pharmacist will outreach to patient by telephone again on 10/13 at 8:30 am  Wallace Cullens, PharmD,  [facility] [phone]

## 2022-06-19 ENCOUNTER — Ambulatory Visit: Payer: BC Managed Care – PPO | Admitting: Internal Medicine

## 2022-06-19 ENCOUNTER — Encounter: Payer: Self-pay | Admitting: Internal Medicine

## 2022-06-19 DIAGNOSIS — Z6831 Body mass index (BMI) 31.0-31.9, adult: Secondary | ICD-10-CM

## 2022-06-19 DIAGNOSIS — E6609 Other obesity due to excess calories: Secondary | ICD-10-CM

## 2022-06-19 DIAGNOSIS — I10 Essential (primary) hypertension: Secondary | ICD-10-CM | POA: Diagnosis not present

## 2022-06-19 NOTE — Progress Notes (Signed)
Subjective:    Patient ID: Dylan Webster, male    DOB: 10-02-1976, 46 y.o.   MRN: [id number]  HPI  Patient presents to clinic today for 2-week follow-up of HTN.  At his last visit, his BP was elevated.  He reports he was not taking his Amlodipine 5 mg twice daily as prescribed.  He has been more consistent with this.  His BP today is 134/80.  ECG from 11/2021 reviewed.  Review of Systems     Past Medical History:  Diagnosis Date   Allergy    GERD (gastroesophageal reflux disease)    HTN (hypertension)    OSA (obstructive sleep apnea)    Osteoarthritis of knees, bilateral    Sleep apnea    uses CPAP    Current Outpatient Medications  Medication Sig Dispense Refill   acetaminophen (TYLENOL) 500 MG tablet Take 500 mg by mouth every 6 (six) hours as needed for mild pain or moderate pain.     amLODipine (NORVASC) 5 MG tablet Take 1 tablet (5 mg total) by mouth 2 (two) times daily. (Patient taking differently: Take 10 mg by mouth daily.) 180 tablet 1   diclofenac (VOLTAREN) 75 MG EC tablet TAKE 1 TABLET BY MOUTH TWICE A DAY WITH MEALS (Patient taking differently: TAKE 1 TABLET BY MOUTH TWICE A DAY WITH MEALS, as needed) 60 tablet 0   diclofenac Sodium (VOLTAREN ARTHRITIS PAIN) 1 % GEL Apply topically 4 (four) times daily as needed.     hydrOXYzine (ATARAX) 10 MG tablet Take 1 tablet (10 mg total) by mouth daily as needed. 90 tablet 0   omeprazole (PRILOSEC) 20 MG capsule Take 1 capsule (20 mg total) by mouth daily. 90 capsule 1   No current facility-administered medications for this visit.    No Known Allergies  Family History  Problem Relation Age of Onset   Alcohol abuse Mother    Diabetes Mother    Alcohol abuse Father    Heart disease Father    Hyperlipidemia Maternal Aunt    Leukemia Maternal Aunt    Heart disease Paternal Uncle    Hypertension Paternal Uncle    Alcohol abuse Paternal Grandfather    Hyperlipidemia Paternal Grandfather    Heart disease Paternal  Grandfather    Hypertension Paternal Grandfather    Diabetes Paternal Grandfather    Kidney disease Paternal Grandfather    Colon cancer Neg Hx    Stomach cancer Neg Hx    Esophageal cancer Neg Hx    Rectal cancer Neg Hx     Social History   Socioeconomic History   Marital status: Married    Spouse name: Not on file   Number of children: 4   Years of education: Not on file   Highest education level: Not on file  Occupational History   Occupation: maintenance  Tobacco Use   Smoking status: Former    Packs/day: 1.00    Years: 12.00    Total pack years: 12.00    Types: Cigarettes    Quit date: 12/22/2018    Years since quitting: 3.4   Smokeless tobacco: Never   Tobacco comments:    quit 12/2018  Vaping Use   Vaping Use: Never used  Substance and Sexual Activity   Alcohol use: No   Drug use: No   Sexual activity: Yes    Birth control/protection: None  Other Topics Concern   Not on file  Social History Narrative   ** Merged History Encounter **  Married, 4 children  Job: [name]  Will start exercising regularly   Social Determinants of Health   Financial Resource Strain: Not on file  Food Insecurity: Not on file  Transportation Needs: Not on file  Physical Activity: Not on file  Stress: Not on file  Social Connections: Not on file  Intimate Partner Violence: Not on file     Constitutional: Denies fever, malaise, fatigue, headache or abrupt weight changes.  HEENT: Denies eye pain, eye redness, ear pain, ringing in the ears, wax buildup, runny nose, nasal congestion, bloody nose, or sore throat. Respiratory: Denies difficulty breathing, shortness of breath, cough or sputum production.   Cardiovascular: Denies chest pain, chest tightness, palpitations or swelling in the hands or feet.  Neurological: Denies dizziness, difficulty with memory, difficulty with speech or problems with balance and coordination.    No other specific complaints  in a complete review of systems (except as listed in HPI above).  Objective:   Physical Exam  BP 134/80 (BP Location: Left Arm, Patient Position: Sitting, Cuff Size: Large)   Pulse 64   Temp 97.7 F (36.5 C) (Temporal)   Wt 210 lb (95.3 kg)   SpO2 99%   BMI 31.01 kg/m   Wt Readings from Last 3 Encounters:  06/05/22 208 lb (94.3 kg)  06/05/22 208 lb (94.3 kg)  05/04/22 209 lb (94.8 kg)    General: Appears his stated age, obese, in NAD. Cardiovascular: Normal rate and rhythm. S1,S2 noted.  No murmur, rubs or gallops noted. No JVD or BLE edema.  Pulmonary/Chest: Normal effort and positive vesicular breath sounds. No respiratory distress. No wheezes, rales or ronchi noted.  Neurological: Alert and oriented.  BMET    Component Value Date/Time   NA 140 06/05/2022 0815   NA 141 12/30/2019 1423   K 3.9 06/05/2022 0815   CL 107 06/05/2022 0815   CO2 26 06/05/2022 0815   GLUCOSE 95 06/05/2022 0815   BUN 20 06/05/2022 0815   BUN 16 12/30/2019 1423   CREATININE 0.80 06/05/2022 0815   CALCIUM 9.7 06/05/2022 0815   GFRNONAA >60 11/18/2021 0237   GFRAA 128 12/30/2019 1423    Lipid Panel     Component Value Date/Time   CHOL 151 06/05/2022 0815   TRIG 73 06/05/2022 0815   HDL 50 06/05/2022 0815   CHOLHDL 3.0 06/05/2022 0815   VLDL 19.4 06/09/2020 1553   LDLCALC 85 06/05/2022 0815    CBC    Component Value Date/Time   WBC 7.4 11/18/2021 0237   RBC 3.77 (L) 11/18/2021 0237   HGB 12.3 (L) 11/18/2021 0237   HCT 35.4 (L) 11/18/2021 0237   PLT 238 11/18/2021 0237   MCV 93.9 11/18/2021 0237   MCH 32.6 11/18/2021 0237   MCHC 34.7 11/18/2021 0237   RDW 12.1 11/18/2021 0237   LYMPHSABS 3.8 04/07/2021 0238   MONOABS 0.7 04/07/2021 0238   EOSABS 0.3 04/07/2021 0238   BASOSABS 0.0 04/07/2021 0238    Hgb A1C Lab Results  Component Value Date   HGBA1C 4.7 11/29/2021           Assessment & Plan:     RTC in 6 months for your annual exam Webb Silversmith, NP

## 2022-06-19 NOTE — Assessment & Plan Note (Signed)
Controlled on amlodipine 5 mg twice daily Reinforced DASH diet and exercise for weight loss

## 2022-06-19 NOTE — Assessment & Plan Note (Signed)
Encourage diet and exercise for weight loss 

## 2022-06-19 NOTE — Patient Instructions (Signed)
How to Take Your Blood Pressure Blood pressure measures how strongly your blood is pressing against the walls of your arteries. Arteries are blood vessels that carry blood from your heart throughout your body. You can take your blood pressure at home with a machine. You may need to check your blood pressure at home: To check if you have high blood pressure (hypertension). To check your blood pressure over time. To make sure your blood pressure medicine is working. Supplies needed: Blood pressure machine, or monitor. A chair to sit in. This should be a chair where you can sit upright with your back supported. Do not sit on a soft couch or an armchair. Table or desk. Small notebook. Pencil or pen. How to prepare Avoid these things for 30 minutes before checking your blood pressure: Having drinks with caffeine in them, such as coffee or tea. Drinking alcohol. Eating. Smoking. Exercising. Do these things five minutes before checking your blood pressure: Go to the bathroom and pee (urinate). Sit in a chair. Be quiet. Do not talk. How to take your blood pressure Follow the instructions that came with your machine. If you have a digital blood pressure monitor, these may be the instructions: Sit up straight. Place your feet on the floor. Do not cross your ankles or legs. Rest your left arm at the level of your heart. You may rest it on a table, desk, or chair. Pull up your shirt sleeve. Wrap the blood pressure cuff around the upper part of your left arm. The cuff should be 1 inch (2.5 cm) above your elbow. It is best to wrap the cuff around bare skin. Fit the cuff snugly around your arm, but not too tightly. You should be able to place only one finger between the cuff and your arm. Place the cord so that it rests in the bend of your elbow. Press the power button. Sit quietly while the cuff fills with air and loses air. Write down the numbers on the screen. Wait 2-3 minutes and then repeat  steps 1-10. What do the numbers mean? Two numbers make up your blood pressure. The first number is called systolic pressure. The second is called diastolic pressure. An example of a blood pressure reading is "120 over 80" (or 120/80). If you are an adult and do not have a medical condition, use this guide to find out if your blood pressure is normal: Normal First number: below 120. Second number: below 80. Elevated First number: 120-129. Second number: below 80. Hypertension stage 1 First number: 130-139. Second number: 80-89. Hypertension stage 2 First number: 140 or above. Second number: 90 or above. Your blood pressure is above normal even if only the first or only the second number is above normal. Follow these instructions at home: Medicines Take over-the-counter and prescription medicines only as told by your doctor. Tell your doctor if your medicine is causing side effects. General instructions Check your blood pressure as often as your doctor tells you to. Check your blood pressure at the same time every day. Take your monitor to your next doctor's appointment. Your doctor will: Make sure you are using it correctly. Make sure it is working right. Understand what your blood pressure numbers should be. Keep all follow-up visits. General tips You will need a blood pressure machine or monitor. Your doctor can suggest a monitor. You can buy one at a drugstore or online. When choosing one: Choose one with an arm cuff. Choose one that wraps around your   upper arm. Only one finger should fit between your arm and the cuff. Do not choose one that measures your blood pressure from your wrist or finger. Where to find more information American Heart Association: www.heart.org Contact a doctor if: Your blood pressure keeps being high. Your blood pressure is suddenly low. Get help right away if: Your first blood pressure number is higher than 180. Your second blood pressure number is  higher than 120. These symptoms may be an emergency. Do not wait to see if the symptoms will go away. Get help right away. Call 911. Summary Check your blood pressure at the same time every day. Avoid caffeine, alcohol, smoking, and exercise for 30 minutes before checking your blood pressure. Make sure you understand what your blood pressure numbers should be. This information is not intended to replace advice given to you by your health care provider. Make sure you discuss any questions you have with your health care provider. Document Revised: 06/08/2021 Document Reviewed: 06/08/2021 Elsevier Patient Education  2023 Elsevier Inc.  

## 2022-07-20 ENCOUNTER — Telehealth: Payer: Self-pay | Admitting: Pharmacist

## 2022-07-20 ENCOUNTER — Telehealth: Payer: BC Managed Care – PPO

## 2022-07-20 NOTE — Telephone Encounter (Signed)
   Outreach Note  07/20/2022 Name: Dylan Webster MRN: [id number] DOB: 02-17-1976  Referred by: Jearld Fenton, NP Reason for referral : No chief complaint on file.   Was unable to reach patient via telephone today and unable to leave a message as patient's voicemail is full.   Follow Up Plan: Will collaborate with Care Guide to outreach to schedule follow up with me  Wallace Cullens, PharmD, [place] [facility] [phone]

## 2022-07-25 ENCOUNTER — Other Ambulatory Visit: Payer: Self-pay | Admitting: Internal Medicine

## 2022-07-25 NOTE — Telephone Encounter (Signed)
Requested Prescriptions  Pending Prescriptions Disp Refills  . omeprazole (PRILOSEC) 20 MG capsule [Pharmacy Med Name: OMEPRAZOLE 20 MG CAP 20 Capsule] 90 capsule 1    Sig: TAKE 1 CAPSULE (20 MG TOTAL) BY MOUTH DAILY.     Gastroenterology: Proton Pump Inhibitors Passed - 07/25/2022 10:19 AM      Passed - Valid encounter within last 12 months    Recent Outpatient Visits          1 month ago Hypertension, essential   [facility] [place], Coralie Keens, NP   1 month ago Pure hypercholesterolemia   [place], Coralie Keens, NP   7 months ago Encounter for general adult medical examination with abnormal findings   [facility] [place], Coralie Keens, NP

## 2022-07-27 ENCOUNTER — Ambulatory Visit: Payer: BC Managed Care – PPO | Admitting: Pharmacist

## 2022-07-27 DIAGNOSIS — I10 Essential (primary) hypertension: Secondary | ICD-10-CM

## 2022-07-27 NOTE — Chronic Care Management (AMB) (Signed)
Chronic Care Management   Outreach Note  07/27/2022 Name: Dylan Webster MRN: [id number] DOB: 1975/11/21  I connected with Dylan Webster on 07/27/22 by telephone outreach and verified that I am speaking with the correct person using two identifiers.  Patient appearing on report for [place] Metric Hypertension Control due to previous documented ambulatory blood pressure of 152/94 on 06/05/2022.   From review of chart, note patient seen for Office Visit with PCP on 06/19/2022. BP 134/80, HR 64 at visit. Provider advised patient to return in 6 months for annual exam  Outreached patient to discuss hypertension control and medication management.   Outpatient Encounter Medications as of 07/27/2022  Medication Sig Note   acetaminophen (TYLENOL) 500 MG tablet Take 500 mg by mouth every 6 (six) hours as needed for mild pain or moderate pain.    amLODipine (NORVASC) 5 MG tablet Take 1 tablet (5 mg total) by mouth 2 (two) times daily. (Patient taking differently: Take 10 mg by mouth daily.)    diclofenac Sodium (VOLTAREN ARTHRITIS PAIN) 1 % GEL Apply topically 4 (four) times daily as needed.    ibuprofen (ADVIL) 200 MG tablet Take 200 mg by mouth daily as needed. 07/27/2022: Reports takes rarely   omeprazole (PRILOSEC) 20 MG capsule TAKE 1 CAPSULE (20 MG TOTAL) BY MOUTH DAILY.    hydrOXYzine (ATARAX) 10 MG tablet Take 1 tablet (10 mg total) by mouth daily as needed.    [DISCONTINUED] diclofenac (VOLTAREN) 75 MG EC tablet TAKE 1 TABLET BY MOUTH TWICE A DAY WITH MEALS (Patient taking differently: TAKE 1 TABLET BY MOUTH TWICE A DAY WITH MEALS, as needed)    No facility-administered encounter medications on file as of 07/27/2022.    Lab Results  Component Value Date   CREATININE 0.80 06/05/2022   BUN 20 06/05/2022   NA 140 06/05/2022   K 3.9 06/05/2022   CL 107 06/05/2022   CO2 26 06/05/2022    BP Readings from Last 3 Encounters:  06/19/22 134/80  06/05/22 (!) 152/94  05/04/22 (!) 142/82     Pulse Readings from Last 3 Encounters:  06/19/22 64  06/05/22 73  05/04/22 75    Current medications: amlodipine 5 mg - 2 tablets (10 mg) each morning    Reports using weekly pillbox to aid with adherence. Denies missed doses   Reports limiting NSAID use. Reports relief of knee pain with use of acetaminophen, using OTC Voltaren gel to joints as needed and using a knee pillow when he sleeps. No longer using OTC using diclofenac tablets, but uses OTC ibuprofen 200 mg only rarely if needed in place of Voltaren gel     Home Monitoring: Patient has an automated upper arm home BP machine Denies checking home blood pressure recently   Denies adding salt to his food, but reports meals typically prepared by family Discuss with patient importance of limiting salt/sodium intake Reports discussing with family who prepares his meals reducing salt intake   Exercise: reports is up and down throughout the day lifting at work.   Reports uses CPAP consistently whenever he sleeps   Assessment/Plan: - Reviewed goal blood pressure <130/80 - Counseled on long term microvascular and macrovascular complications of uncontrolled hypertension - Reviewed appropriate home BP monitoring technique (avoid caffeine, smoking, and exercise for 30 minutes before checking, rest for at least 5 minutes before taking BP, sit with feet flat on the floor and back against a hard surface, uncross legs, and rest arm on flat surface) -  Will mail patient blood pressure log and education handout on monitoring technique as requested - Reviewed to check blood pressure, document, and provide at next provider visit - Discussed dietary modifications, such as reduced salt intake, focus on whole grains, vegetables, lean proteins   Follow Up Plan:  Patient denies further medication questions or concerns today Provide patient with contact information for clinic pharmacist to contact if needed in future for medication  questions/concerns   Wallace Cullens, PharmD, [facility] [place] [phone]

## 2022-07-27 NOTE — Patient Instructions (Signed)
Check your blood pressure once-twice weekly, and any time you have concerning symptoms like headache, chest pain, dizziness, shortness of breath, or vision changes.   Our goal is less than 130/80.  To appropriately check your blood pressure, make sure you do the following:  1) Avoid caffeine, exercise, or tobacco products for 30 minutes before checking. Empty your bladder. 2) Sit with your back supported in a flat-backed chair. Rest your arm on something flat (arm of the chair, table, etc). 3) Sit still with your feet flat on the floor, resting, for at least 5 minutes.  4) Check your blood pressure. Take 1-2 readings.  5) Write down these readings and bring with you to any provider appointments.  Bring your home blood pressure machine with you to a provider's office for accuracy comparison at least once a year.   Make sure you take your blood pressure medications before you come to any office visit, even if you were asked to fast for labs.  Wallace Cullens, PharmD, [place] [phone]

## 2022-10-29 ENCOUNTER — Encounter: Payer: Self-pay | Admitting: Internal Medicine

## 2022-10-30 ENCOUNTER — Ambulatory Visit: Payer: BC Managed Care – PPO | Admitting: Internal Medicine

## 2022-10-30 ENCOUNTER — Encounter: Payer: Self-pay | Admitting: Internal Medicine

## 2022-10-30 VITALS — BP 130/78 | HR 77 | Temp 97.1°F | Wt 225.0 lb

## 2022-10-30 DIAGNOSIS — Z01818 Encounter for other preprocedural examination: Secondary | ICD-10-CM | POA: Diagnosis not present

## 2022-10-30 DIAGNOSIS — Z6833 Body mass index (BMI) 33.0-33.9, adult: Secondary | ICD-10-CM

## 2022-10-30 DIAGNOSIS — Z0289 Encounter for other administrative examinations: Secondary | ICD-10-CM | POA: Diagnosis not present

## 2022-10-30 DIAGNOSIS — M1711 Unilateral primary osteoarthritis, right knee: Secondary | ICD-10-CM

## 2022-10-30 DIAGNOSIS — E6609 Other obesity due to excess calories: Secondary | ICD-10-CM

## 2022-10-30 MED ORDER — OMEPRAZOLE 20 MG PO CPDR
20.0000 mg | DELAYED_RELEASE_CAPSULE | Freq: Every day | ORAL | 0 refills | Status: DC
Start: 2022-10-30 — End: 2023-02-08

## 2022-10-30 MED ORDER — AMLODIPINE BESYLATE 5 MG PO TABS: 10.0000 mg | ORAL_TABLET | Freq: Every day | ORAL | 0 refills | Status: DC

## 2022-10-30 NOTE — Assessment & Plan Note (Addendum)
Encourage diet and exercise for weight loss as this can help reduce joint pain

## 2022-10-30 NOTE — Patient Instructions (Signed)
Arthritis ?Arthritis means joint pain. It can also mean joint disease. A joint is a place where bones come together. There are more than 100 types of arthritis. ?What are the causes? ?Wear and tear of a joint. This is the most common cause. ?Too much of a chemical called uric acid in the blood, which leads to pain in the joint (gout). ?Pain and swelling (inflammation) in a joint. ?Infection of a joint. ?Injuries in the joint. ?A reaction to medicines (allergy). ?In some cases, the cause may not be known. ?What are the signs or symptoms? ?Pain in a joint when moving. ?Redness at a joint. ?Swelling at a joint. ?Stiffness at a joint. ?Warmth coming from the joint. ?A fever. ?A feeling of being sick. ?How is this treated? ?This condition may be treated with: ?Treating the cause, if it is known. ?Rest. ?Raising (elevating) the joint. ?Putting cold or hot packs on the joint. ?Medicines to treat symptoms and reduce pain and swelling. ?Shots (injections) of medicines, such as cortisone, into the joint. ?You may also be told to make changes in your life, such as doing exercises and losing weight. ?Follow these instructions at home: ?Medicines ?Take over-the-counter and prescription medicines only as told by your doctor. ?Do not take aspirin for pain if your doctor says that you may have gout. ?Activity ?Rest your joint if your doctor tells you to. ?Avoid activities that make the pain worse. ?Exercise your joint regularly as told by your doctor. Try doing exercises like: ?Swimming. ?Water aerobics. ?Biking. ?Walking. ?Managing pain, stiffness, and swelling ? ?  ? ?If told, put ice on the affected area. To do this: ?Put ice in a plastic bag. ?Place a towel between your skin and the bag. ?Leave the ice on for 20 minutes, 2-3 times a day. ?Take off the ice if your skin turns bright red. This is very important. If you cannot feel pain, heat, or cold, you have a greater risk of damage to the area. ?If your joint is swollen, raise  (elevate) it above the level of your heart if told by your doctor. ?If your joint feels stiff in the morning, try taking a warm shower. ?If told, put heat on the affected area. Do this as often as told by your doctor. Use the heat source that your doctor recommends, such as a moist heat pack or a heating pad. If you have diabetes, do not apply heat without asking your doctor. To apply heat: ?Place a towel between your skin and the heat source. ?Leave the heat on for 20-30 minutes. ?Take off the heat if your skin turns bright red. This is very important. If you cannot feel pain, heat, or cold, you have a greater risk of getting burned. ?General instructions ?Maintain a healthy weight. Follow instructions from your doctor for weight control. ?Do not smoke or use any products that contain nicotine or tobacco. If you need help quitting, ask your doctor. ?Keep all follow-up visits. ?Where to find more information ?National Institutes of Health: www.niams.nih.gov ?Contact a doctor if: ?The pain gets worse. ?You have a fever. ?Get help right away if: ?You have very bad pain in your joint. ?You have swelling in your joint. ?Your joint is red. ?Many joints become painful and swollen. ?You have very bad back pain. ?Your leg is very weak. ?Summary ?Arthritis means joint pain. It can also mean joint disease. A joint is a place where bones come together. ?The most common cause of this condition is wear and   tear of a joint. ?Symptoms of this condition include redness, swelling, or stiffness of the joint. ?This condition is treated with rest, raising the joint, medicines, and putting cold or hot packs on the joint. ?Follow your doctor's instructions about medicines, activity, exercises, and other home care treatments. ?This information is not intended to replace advice given to you by your health care provider. Make sure you discuss any questions you have with your health care provider. ?Document Revised: 07/04/2021 Document  Reviewed: 07/04/2021 ?Elsevier Patient Education ? 2023 Elsevier Inc. ? ?

## 2022-10-30 NOTE — Progress Notes (Signed)
Subjective:    Patient ID: Dylan Webster, male    DOB: 30-Mar-1976, 47 y.o.   MRN: [id number]  HPI  Patient presents to clinic today for surgical clearance.  He will be having a right total knee replacement by Dr. Percell Miller.  They have also requested pulmonology clearance due to his history of OSA.  Review of Systems     Past Medical History:  Diagnosis Date   Allergy    GERD (gastroesophageal reflux disease)    HTN (hypertension)    OSA (obstructive sleep apnea)    Osteoarthritis of knees, bilateral    Sleep apnea    uses CPAP    Current Outpatient Medications  Medication Sig Dispense Refill   acetaminophen (TYLENOL) 500 MG tablet Take 500 mg by mouth every 6 (six) hours as needed for mild pain or moderate pain.     amLODipine (NORVASC) 5 MG tablet Take 1 tablet (5 mg total) by mouth 2 (two) times daily. (Patient taking differently: Take 10 mg by mouth daily.) 180 tablet 1   diclofenac Sodium (VOLTAREN ARTHRITIS PAIN) 1 % GEL Apply topically 4 (four) times daily as needed.     hydrOXYzine (ATARAX) 10 MG tablet Take 1 tablet (10 mg total) by mouth daily as needed. 90 tablet 0   ibuprofen (ADVIL) 200 MG tablet Take 200 mg by mouth daily as needed.     omeprazole (PRILOSEC) 20 MG capsule TAKE 1 CAPSULE (20 MG TOTAL) BY MOUTH DAILY. 90 capsule 0   No current facility-administered medications for this visit.    No Known Allergies  Family History  Problem Relation Age of Onset   Alcohol abuse Mother    Diabetes Mother    Alcohol abuse Father    Heart disease Father    Hyperlipidemia Maternal Aunt    Leukemia Maternal Aunt    Heart disease Paternal Uncle    Hypertension Paternal Uncle    Alcohol abuse Paternal Grandfather    Hyperlipidemia Paternal Grandfather    Heart disease Paternal Grandfather    Hypertension Paternal Grandfather    Diabetes Paternal Grandfather    Kidney disease Paternal Grandfather    Colon cancer Neg Hx    Stomach cancer Neg Hx    Esophageal cancer  Neg Hx    Rectal cancer Neg Hx     Social History   Socioeconomic History   Marital status: Married    Spouse name: Not on file   Number of children: 4   Years of education: Not on file   Highest education level: Not on file  Occupational History   Occupation: maintenance  Tobacco Use   Smoking status: Former    Packs/day: 1.00    Years: 12.00    Total pack years: 12.00    Types: Cigarettes    Quit date: 12/22/2018    Years since quitting: 3.8   Smokeless tobacco: Never   Tobacco comments:    quit 12/2018  Vaping Use   Vaping Use: Never used  Substance and Sexual Activity   Alcohol use: No   Drug use: No   Sexual activity: Yes    Birth control/protection: None  Other Topics Concern   Not on file  Social History Narrative   ** Merged History Encounter **       Married, 4 children  Job: [place]  Will start exercising regularly   Social Determinants of Health   Financial Resource Strain: Not on file  Food Insecurity: Not on file  Transportation Needs: Not on file  Physical Activity: Not on file  Stress: Not on file  Social Connections: Not on file  Intimate Partner Violence: Not on file     Constitutional: Denies fever, malaise, fatigue, headache or abrupt weight changes.  Respiratory: Denies difficulty breathing, shortness of breath, cough or sputum production.   Cardiovascular: Denies chest pain, chest tightness, palpitations or swelling in the hands or feet.  Musculoskeletal: Patient reports joint pain in knees.  Denies decrease in range of motion, difficulty with gait, muscle pain or joint swelling.  Skin: Denies redness, rashes, lesions or ulcercations.  Neurological: Denies numbness, tingling, weakness or problems with balance and coordination.  Psych: Patient has a history of anxiety.  Denies depression, SI/HI.  No other specific complaints in a complete review of systems (except as listed in HPI above).  Objective:   Physical  Exam  BP 130/78 (BP Location: Right Arm, Patient Position: Sitting, Cuff Size: Large)   Pulse 77   Temp (!) 97.1 F (36.2 C) (Temporal)   Wt 225 lb (102.1 kg)   SpO2 100%   BMI 33.23 kg/m   Wt Readings from Last 3 Encounters:  06/19/22 210 lb (95.3 kg)  06/05/22 208 lb (94.3 kg)  06/05/22 208 lb (94.3 kg)    General: Appears his stated age, obese, in NAD. Cardiovascular: Normal rate and rhythm. S1,S2 noted.  No murmur, rubs or gallops noted. No JVD or BLE edema.  Pulmonary/Chest: Normal effort and positive vesicular breath sounds. No respiratory distress. No wheezes, rales or ronchi noted.  Musculoskeletal: Normal flexion and extension of the knees.  No joint swelling noted.  Pain with palpation on the medial joint line of the right knee.  Strength 5/5 BLE no difficulty with gait.  Neurological: Alert and oriented. Coordination normal.     BMET    Component Value Date/Time   NA 140 06/05/2022 0815   NA 141 12/30/2019 1423   K 3.9 06/05/2022 0815   CL 107 06/05/2022 0815   CO2 26 06/05/2022 0815   GLUCOSE 95 06/05/2022 0815   BUN 20 06/05/2022 0815   BUN 16 12/30/2019 1423   CREATININE 0.80 06/05/2022 0815   CALCIUM 9.7 06/05/2022 0815   GFRNONAA >60 11/18/2021 0237   GFRAA 128 12/30/2019 1423    Lipid Panel     Component Value Date/Time   CHOL 151 06/05/2022 0815   TRIG 73 06/05/2022 0815   HDL 50 06/05/2022 0815   CHOLHDL 3.0 06/05/2022 0815   VLDL 19.4 06/09/2020 1553   LDLCALC 85 06/05/2022 0815    CBC    Component Value Date/Time   WBC 7.4 11/18/2021 0237   RBC 3.77 (L) 11/18/2021 0237   HGB 12.3 (L) 11/18/2021 0237   HCT 35.4 (L) 11/18/2021 0237   PLT 238 11/18/2021 0237   MCV 93.9 11/18/2021 0237   MCH 32.6 11/18/2021 0237   MCHC 34.7 11/18/2021 0237   RDW 12.1 11/18/2021 0237   LYMPHSABS 3.8 04/07/2021 0238   MONOABS 0.7 04/07/2021 0238   EOSABS 0.3 04/07/2021 0238   BASOSABS 0.0 04/07/2021 0238    Hgb A1C Lab Results  Component Value  Date   HGBA1C 4.7 11/29/2021           Assessment & Plan:   Preoperative Clearance for Osteoarthritis Right Knee, Encounter for Form Completion:  Indication for ECG: Preoperative clearance Interpretation of ECG: Regular rate and rhythm Comparison of ECG: 11/2021, unchanged Labs are being ordered by the surgeons office He will  call pulmonology to discuss clearance with them as well  RTC in 2 months for annual exam Webb Silversmith, NP

## 2022-11-01 ENCOUNTER — Telehealth: Payer: Self-pay | Admitting: Nurse Practitioner

## 2022-11-01 NOTE — Telephone Encounter (Signed)
Fax received from Dr. Edmonia Lynch with [facility] to perform a RIGHT TOTAL KNEE REPLACEMENT WITH SPINAL ANESTHESIA on patient.  Patient needs surgery clearance. Surgery is PENDING. Patient was seen on 03/08/2022. Office protocol is a risk assessment can be sent to surgeon if patient has been seen in 60 days or less.   Sending to Roxan Diesel NP for risk assessment or recommendations if patient needs to be seen in office prior to surgical procedure.    Patient has office visit with Katie on 11/09/2022 for surgical clearance

## 2022-11-09 ENCOUNTER — Ambulatory Visit: Payer: BC Managed Care – PPO | Admitting: Nurse Practitioner

## 2022-11-09 ENCOUNTER — Encounter: Payer: Self-pay | Admitting: Nurse Practitioner

## 2022-11-09 VITALS — BP 138/82 | HR 75 | Temp 98.4°F | Ht 67.0 in | Wt 225.8 lb

## 2022-11-09 DIAGNOSIS — Z01811 Encounter for preprocedural respiratory examination: Secondary | ICD-10-CM | POA: Diagnosis not present

## 2022-11-09 DIAGNOSIS — G4733 Obstructive sleep apnea (adult) (pediatric): Secondary | ICD-10-CM | POA: Diagnosis not present

## 2022-11-09 NOTE — Progress Notes (Addendum)
$'@Patient'z$  ID: Dylan Webster, male    DOB: 1976/05/30, 47 y.o.   MRN: [id number]  Chief Complaint  Patient presents with   surgical clearance    Pt states he is going to have bilateral knee surgery.     Referring provider: Jearld Fenton, NP  HPI: 47 year old male, former smoker followed for OSA on CPAP.  He is a patient Dr. Janee Morn and last seen in office 03/08/2022.  Past medical history significant for hypertension, obesity, GERD, anxiety, HLD.  TEST/EVENTS:   03/08/2022: OV with Dr. Annamaria Boots.  Having some trouble with CPAP mask leaking.  Desensitization study ordered.  DME company needs the machine serial number so they can install a card or download software.  Overall pleased with CPAP.  Daytime sleepiness is markedly improved.  Feels safe driving.  11/09/2022: Today -surgical clearance Patient presents today for surgical clearance.  He has a CPAP machine which he uses every night.  They purchase this out-of-pocket.  He was able to bring a download report with him today.  Shows excellent compliance and control.  He is not having trouble with daytime fatigue for the most part.  If he does, is related to lack of sleep.  He is currently working 2 jobs.  But overall, feeling relatively well.  Occasionally has a morning headache but this is due to tightening his strap enough to where his mask does not leak.  Usually goes away quickly once he takes the mask off.  Denies any breakthrough snoring, sleep parasomnia/paralysis, drowsy driving.  10/10/2022-11/08/2022 CPAP 5-15 cmH2O 30/30 days; 93% >4 hr; av use 5 hr 56 min Leak 95th 7.8 Average AHI 0.64/h  No Known Allergies  Immunization History  Administered Date(s) Administered   Influenza,inj,Quad PF,6+ Mos 06/09/2020, 06/05/2022   Moderna Sars-Covid-2 Vaccination 12/09/2019, 01/11/2020, 11/28/2020   Tdap 06/09/2020    Past Medical History:  Diagnosis Date   Allergy    GERD (gastroesophageal reflux disease)    HTN (hypertension)    OSA  (obstructive sleep apnea)    Osteoarthritis of knees, bilateral    Sleep apnea    uses CPAP    Tobacco History: Social History   Tobacco Use  Smoking Status Former   Packs/day: 1.00   Years: 12.00   Total pack years: 12.00   Types: Cigarettes   Quit date: 12/22/2018   Years since quitting: 3.8  Smokeless Tobacco Never  Tobacco Comments   quit 12/2018   Counseling given: Not Answered Tobacco comments: quit 12/2018   Outpatient Medications Prior to Visit  Medication Sig Dispense Refill   acetaminophen (TYLENOL) 500 MG tablet Take 500 mg by mouth every 6 (six) hours as needed for mild pain or moderate pain.     amLODipine (NORVASC) 5 MG tablet Take 2 tablets (10 mg total) by mouth daily. 180 tablet 0   diclofenac Sodium (VOLTAREN ARTHRITIS PAIN) 1 % GEL Apply topically 4 (four) times daily as needed.     hydrOXYzine (ATARAX) 10 MG tablet Take 1 tablet (10 mg total) by mouth daily as needed. 90 tablet 0   ibuprofen (ADVIL) 200 MG tablet Take 200 mg by mouth daily as needed.     omeprazole (PRILOSEC) 20 MG capsule Take 1 capsule (20 mg total) by mouth daily. 90 capsule 0   No facility-administered medications prior to visit.     Review of Systems:   Constitutional: No weight loss or gain, night sweats, fevers, chills, or lassitude. +occasional fatigue HEENT: No difficulty swallowing, tooth/dental problems,  or sore throat. No sneezing, itching, ear ache, nasal congestion, or post nasal drip. +occasional AM headache CV:  No chest pain, orthopnea, PND, swelling in lower extremities, anasarca, dizziness, palpitations, syncope Resp: No shortness of breath with exertion or at rest. No excess mucus or change in color of mucus. No productive or non-productive. No hemoptysis. No wheezing.  No chest wall deformity GI:  No heartburn, indigestion, abdominal pain GU: No dysuria, change in color of urine, urgency or frequency.   Skin: No rash, lesions, ulcerations MSK:  No joint pain or  swelling.   Neuro: No dizziness or lightheadedness.  Psych: No depression or anxiety. Mood stable.     Physical Exam:  BP 138/82 (BP Location: Right Arm, Patient Position: Sitting, Cuff Size: Normal)   Pulse 75   Temp 98.4 F (36.9 C) (Oral)   Ht '5\' 7"'$  (1.702 m)   Wt 225 lb 12.8 oz (102.4 kg)   SpO2 99%   BMI 35.37 kg/m   GEN: Pleasant, interactive, well-appearing; obese; in no acute distress. HEENT:  Normocephalic and atraumatic. EACs patent bilaterally. TM pearly gray with present light reflex bilaterally. PERRLA. Sclera white. Nasal turbinates pink, moist and patent bilaterally. No rhinorrhea present. Oropharynx pink and moist, without exudate or edema. No lesions, ulcerations, or postnasal drip. Mallampati III NECK:  Supple w/ fair ROM. No JVD present. Normal carotid impulses w/o bruits. Thyroid symmetrical with no goiter or nodules palpated. No lymphadenopathy.   CV: RRR, no m/r/g, no peripheral edema. Pulses intact, +2 bilaterally. No cyanosis, pallor or clubbing. PULMONARY:  Unlabored, regular breathing. Clear bilaterally A&P w/o wheezes/rales/rhonchi. No accessory muscle use.  GI: BS present and normoactive. Soft, non-tender to palpation. No organomegaly or masses detected.  MSK: No erythema, warmth or tenderness. Cap refil <2 sec all extrem. No deformities or joint swelling noted.  Neuro: A/Ox3. No focal deficits noted.   Skin: Warm, no lesions or rashe Psych: Normal affect and behavior. Judgement and thought content appropriate.     Lab Results:  CBC    Component Value Date/Time   WBC 7.4 11/18/2021 0237   RBC 3.77 (L) 11/18/2021 0237   HGB 12.3 (L) 11/18/2021 0237   HCT 35.4 (L) 11/18/2021 0237   PLT 238 11/18/2021 0237   MCV 93.9 11/18/2021 0237   MCH 32.6 11/18/2021 0237   MCHC 34.7 11/18/2021 0237   RDW 12.1 11/18/2021 0237   LYMPHSABS 3.8 04/07/2021 0238   MONOABS 0.7 04/07/2021 0238   EOSABS 0.3 04/07/2021 0238   BASOSABS 0.0 04/07/2021 0238     BMET    Component Value Date/Time   NA 140 06/05/2022 0815   NA 141 12/30/2019 1423   K 3.9 06/05/2022 0815   CL 107 06/05/2022 0815   CO2 26 06/05/2022 0815   GLUCOSE 95 06/05/2022 0815   BUN 20 06/05/2022 0815   BUN 16 12/30/2019 1423   CREATININE 0.80 06/05/2022 0815   CALCIUM 9.7 06/05/2022 0815   GFRNONAA >60 11/18/2021 0237   GFRAA 128 12/30/2019 1423    BNP No results found for: "BNP"   Imaging:  No results found.        No data to display          No results found for: "NITRICOXIDE"      Assessment & Plan:   OSA (obstructive sleep apnea) Severe OSA on CPAP therapy. He has excellent compliance and control on current settings. Residual AHI 0.64/h. He has tried numerous masks and unless he has his straps  tight, they will leak. He is settled down with the full face for now. Reviewed safe driving practices. Understands risks of untreated OSA. Receives good benefit from use. Encouraged him to continue therapy as he has been.  Patient Instructions  Continue to use CPAP every night, minimum of 4-6 hours a night.  Change equipment every 30 days or as directed by DME. Wash your tubing with warm soap and water daily, hang to dry. Wash humidifier portion weekly.  Be aware of reduced alertness and do not drive or operate heavy machinery if experiencing this or drowsiness.  Exercise encouraged, as tolerated. Notify if persistent daytime sleepiness occurs even with consistent use of CPAP.  Good luck with your surgery! Get out of bed as soon as possible after surgery. Use incentive spirometer 10 times an hour while awake during the recovery period. Use your CPAP with naps and while sleeping. Take your CPAP with you to your surgery.  Follow up as scheduled in June with Dr. Annamaria Boots. If symptoms worsen, please contact office for sooner follow up    Preoperative respiratory examination Low to moderate risk. Factors that increase the risk for postoperative pulmonary  complications are severe OSA and obesity.  Respiratory complications generally occur in 1% of ASA Class I patients, 5% of ASA Class II and 10% of ASA Class III-IV patients These complications rarely result in mortality and include postoperative pneumonia, atelectasis, pulmonary embolism, ARDS and increased time requiring postoperative mechanical ventilation.   Overall, I recommend proceeding with the surgery if the risk for respiratory complications are outweighed by the potential benefits. This will need to be discussed between the patient and surgeon.   To reduce risks of respiratory complications, I recommend: --Pre- and post-operative incentive spirometry performed frequently while awake --Inpatient use of currently prescribed positive-pressure for OSA whenever the patient is sleeping --Short duration of surgery as much as possible and avoid paralytic if possible --OOB, encourage mobility post-op, DVT prophylaxis if indicated   1) RISK FOR PROLONGED MECHANICAL VENTILAION - > 48h  1A) Arozullah - Prolonged mech ventilation risk Arozullah Postperative Pulmonary Risk Score - for mech ventilation dependence >48h [employer], Ann Surg 2000, major non-cardiac surgery) Comment Score  Type of surgery - abd ao aneurysm (27), thoracic (21), neurosurgery / upper abdominal / vascular (21), neck (11) R TKA 5  Emergency Surgery - (11)  0  ALbumin < 3 or poor nutritional state - (9) obesity 5  BUN > 30 -  (8)  0  Partial or completely dependent functional status - (7)  0  COPD -  (6) Severe OSA 6  Age - 60 to 26 (4), > 70  (6) 47 0  TOTAL  16  Risk Stratifcation scores  - < 10 (0.5%), 11-19 (1.8%), 20-27 (4.2%), 28-40 (10.1%), >40 (26.6%)  1.8%    I spent 28 minutes of dedicated to the care of this patient on the date of this encounter to include pre-visit review of records, face-to-face time with the patient discussing conditions above, post visit ordering of testing, clinical documentation  with the electronic health record, making appropriate referrals as documented, and communicating necessary findings to members of the patients care team.  Clayton Bibles, NP 11/09/2022  Pt aware and understands NP's role.

## 2022-11-09 NOTE — Telephone Encounter (Signed)
OV notes and clearance form have been faxed back to [facility]. Nothing further needed at this time.  °

## 2022-11-09 NOTE — Assessment & Plan Note (Signed)
Severe OSA on CPAP therapy. He has excellent compliance and control on current settings. Residual AHI 0.64/h. He has tried numerous masks and unless he has his straps tight, they will leak. He is settled down with the full face for now. Reviewed safe driving practices. Understands risks of untreated OSA. Receives good benefit from use. Encouraged him to continue therapy as he has been.  Patient Instructions  Continue to use CPAP every night, minimum of 4-6 hours a night.  Change equipment every 30 days or as directed by DME. Wash your tubing with warm soap and water daily, hang to dry. Wash humidifier portion weekly.  Be aware of reduced alertness and do not drive or operate heavy machinery if experiencing this or drowsiness.  Exercise encouraged, as tolerated. Notify if persistent daytime sleepiness occurs even with consistent use of CPAP.  Good luck with your surgery! Get out of bed as soon as possible after surgery. Use incentive spirometer 10 times an hour while awake during the recovery period. Use your CPAP with naps and while sleeping. Take your CPAP with you to your surgery.  Follow up as scheduled in June with Dr. Annamaria Boots. If symptoms worsen, please contact office for sooner follow up

## 2022-11-09 NOTE — Telephone Encounter (Signed)
Mychart message as well as an attachment in the media section of the message has been sent by pt.  Media is a download of pt's cpap data.  Routing to [place] for her to be able to view.

## 2022-11-09 NOTE — Patient Instructions (Signed)
Continue to use CPAP every night, minimum of 4-6 hours a night.  Change equipment every 30 days or as directed by DME. Wash your tubing with warm soap and water daily, hang to dry. Wash humidifier portion weekly.  Be aware of reduced alertness and do not drive or operate heavy machinery if experiencing this or drowsiness.  Exercise encouraged, as tolerated. Notify if persistent daytime sleepiness occurs even with consistent use of CPAP.  Good luck with your surgery! Get out of bed as soon as possible after surgery. Use incentive spirometer 10 times an hour while awake during the recovery period. Use your CPAP with naps and while sleeping. Take your CPAP with you to your surgery.  Follow up as scheduled in June with Dr. Annamaria Boots. If symptoms worsen, please contact office for sooner follow up

## 2022-11-09 NOTE — Assessment & Plan Note (Signed)
Low to moderate risk. Factors that increase the risk for postoperative pulmonary complications are severe OSA and obesity.  Respiratory complications generally occur in 1% of ASA Class I patients, 5% of ASA Class II and 10% of ASA Class III-IV patients These complications rarely result in mortality and include postoperative pneumonia, atelectasis, pulmonary embolism, ARDS and increased time requiring postoperative mechanical ventilation.   Overall, I recommend proceeding with the surgery if the risk for respiratory complications are outweighed by the potential benefits. This will need to be discussed between the patient and surgeon.   To reduce risks of respiratory complications, I recommend: --Pre- and post-operative incentive spirometry performed frequently while awake --Inpatient use of currently prescribed positive-pressure for OSA whenever the patient is sleeping --Short duration of surgery as much as possible and avoid paralytic if possible --OOB, encourage mobility post-op, DVT prophylaxis if indicated   1) RISK FOR PROLONGED MECHANICAL VENTILAION - > 48h  1A) Arozullah - Prolonged mech ventilation risk Arozullah Postperative Pulmonary Risk Score - for mech ventilation dependence >48h [employer], Ann Surg 2000, major non-cardiac surgery) Comment Score  Type of surgery - abd ao aneurysm (27), thoracic (21), neurosurgery / upper abdominal / vascular (21), neck (11) R TKA 5  Emergency Surgery - (11)  0  ALbumin < 3 or poor nutritional state - (9) obesity 5  BUN > 30 -  (8)  0  Partial or completely dependent functional status - (7)  0  COPD -  (6) Severe OSA 6  Age - 60 to 82 (4), > 70  (6) 47 0  TOTAL  16  Risk Stratifcation scores  - < 10 (0.5%), 11-19 (1.8%), 20-27 (4.2%), 28-40 (10.1%), >40 (26.6%)  1.8%

## 2022-11-09 NOTE — Addendum Note (Signed)
Addended by: Irine Seal B on: 11/09/2022 09:12 AM   Modules accepted: Orders

## 2022-11-10 IMAGING — CT CT HEAD W/O CM
3 series · 15 of 47 positions shown, 18 images · non-contrast
Comparison: None.

CLINICAL DATA: Acute neurologic deficit

EXAM:
CT HEAD WITHOUT CONTRAST
TECHNIQUE: Contiguous axial images were obtained from the base of the skull
through the vertex without intravenous contrast.

[Series 2: head wo · axial · 0.45mm/px · z∈[-332,-187]mm · 9 of 35 slices shown, 12 images]
[im 3/35  brain]
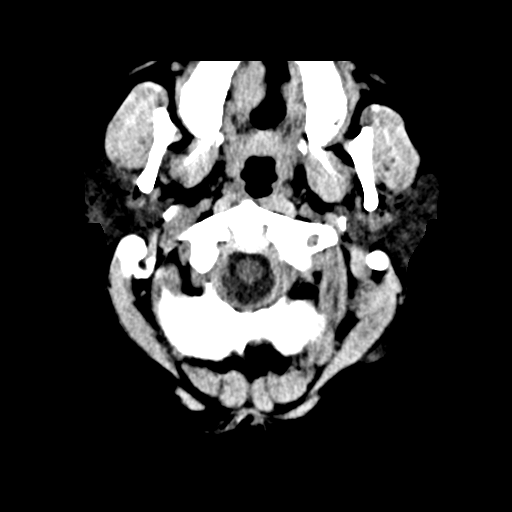
[im 3/35  bone]
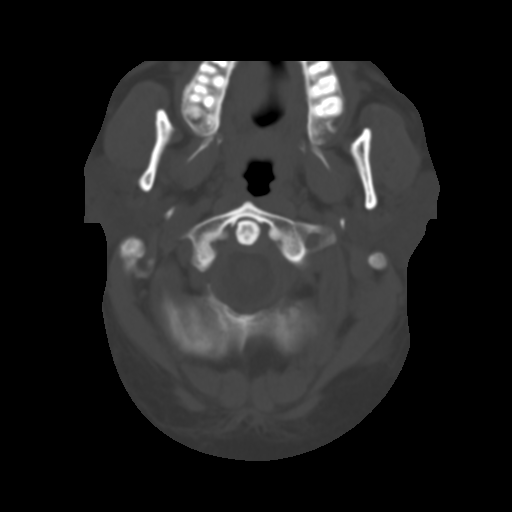
[im 6/35  brain]
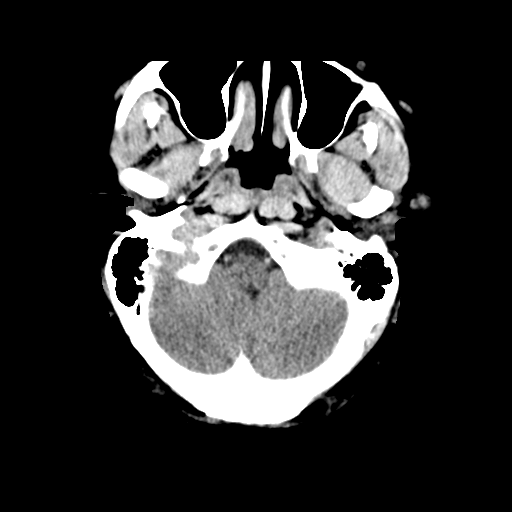
[im 10/35  brain]
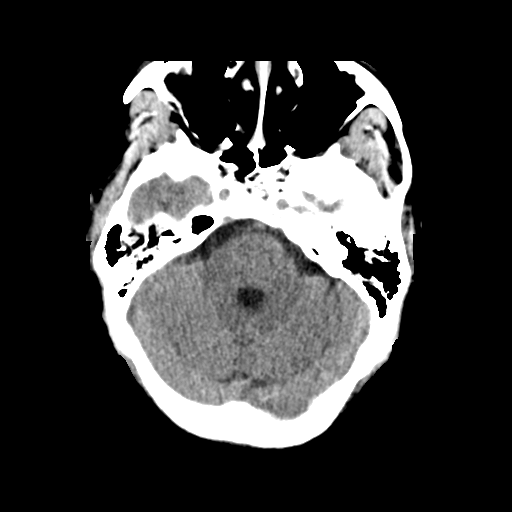
[im 13/35  brain]
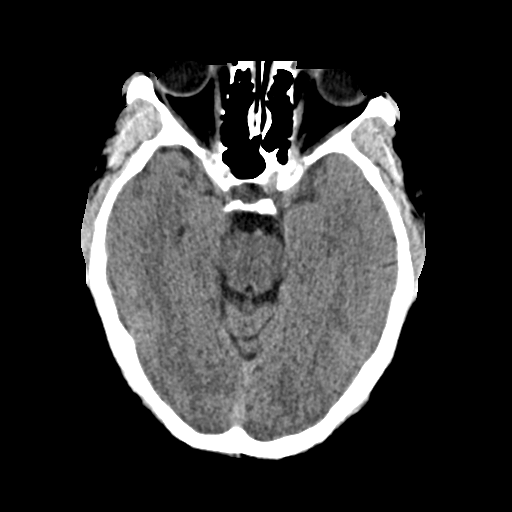
[im 18/35  brain]
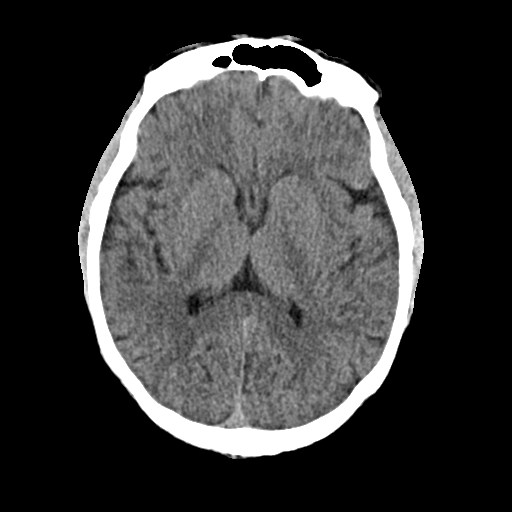
[im 18/35  bone]
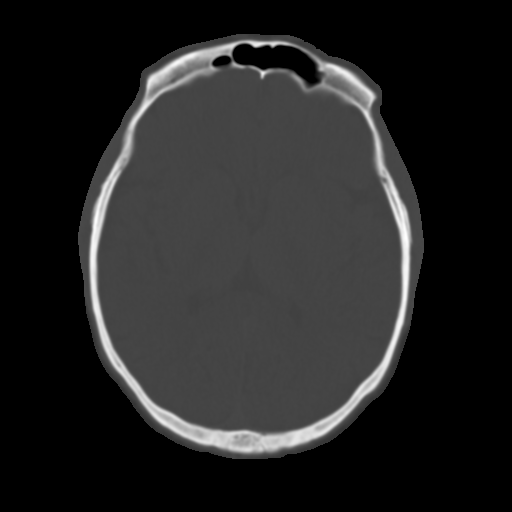
[im 22/35  brain]
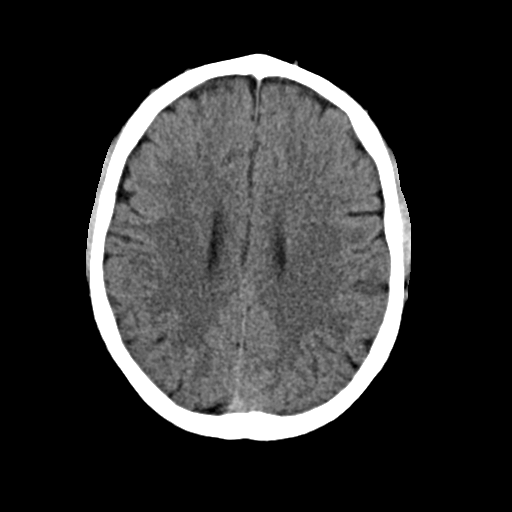
[im 25/35  brain]
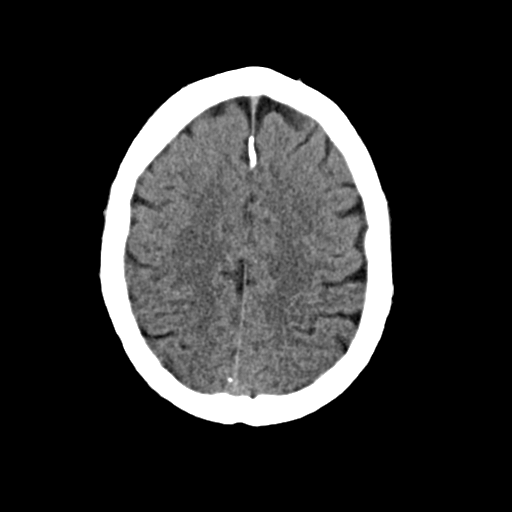
[im 29/35  brain]
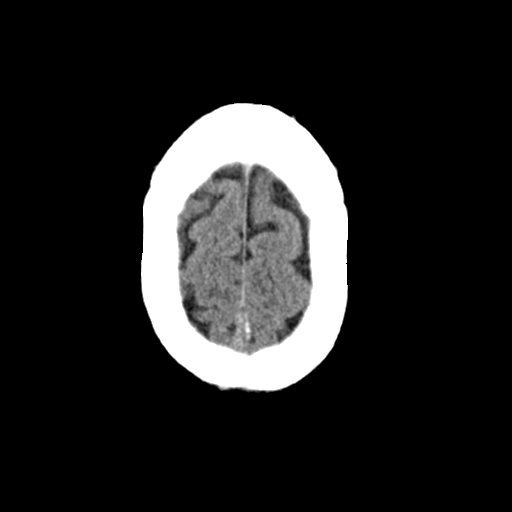
[im 32/35  brain]
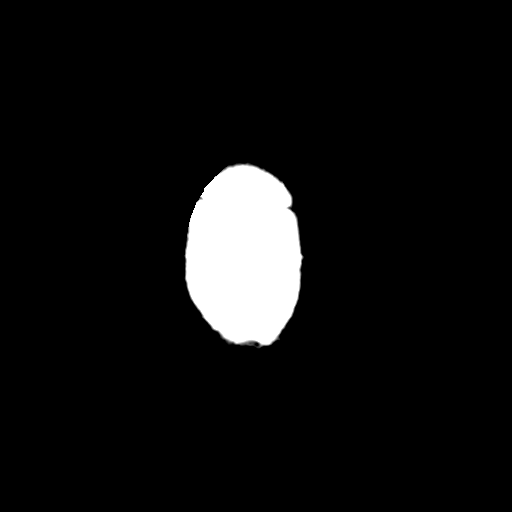
[im 32/35  bone]
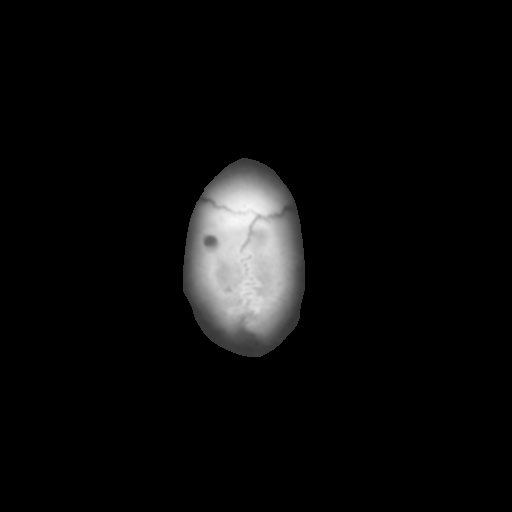

[Series 4: coronal soft · coronal · 0.34mm/px · 3 of 72 slices shown]
[im 24/72  brain]
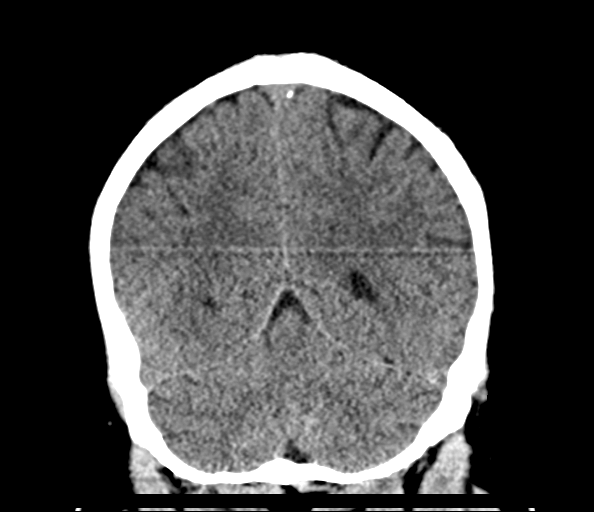
[im 32/72  brain]
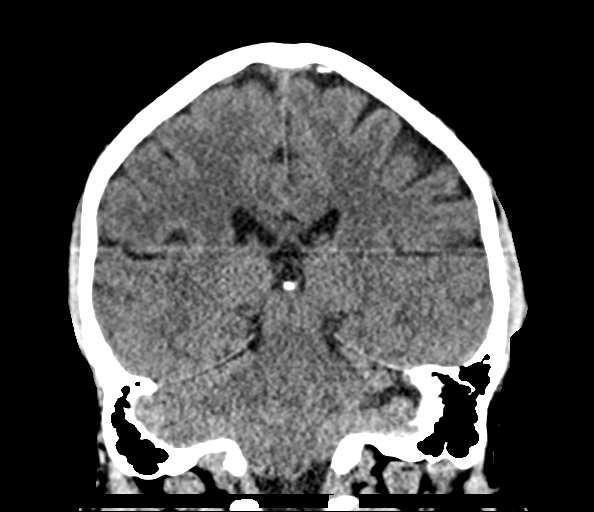
[im 40/72  brain]
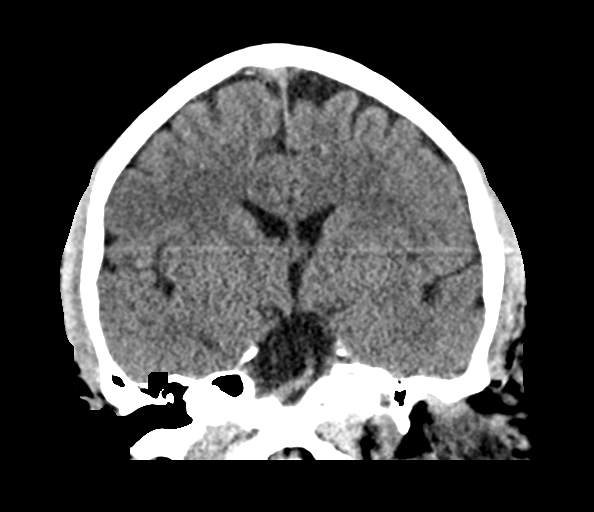

[Series 5: sag soft · sagittal · 0.34mm/px · 3 of 67 slices shown]
[im 23/67  brain]
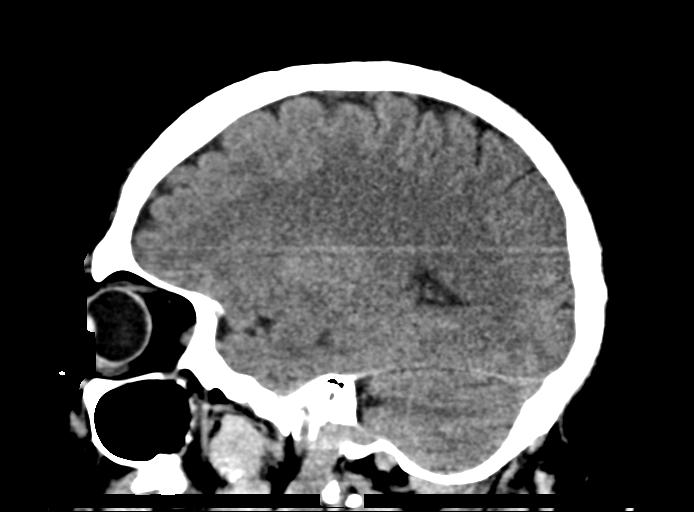
[im 34/67  brain]
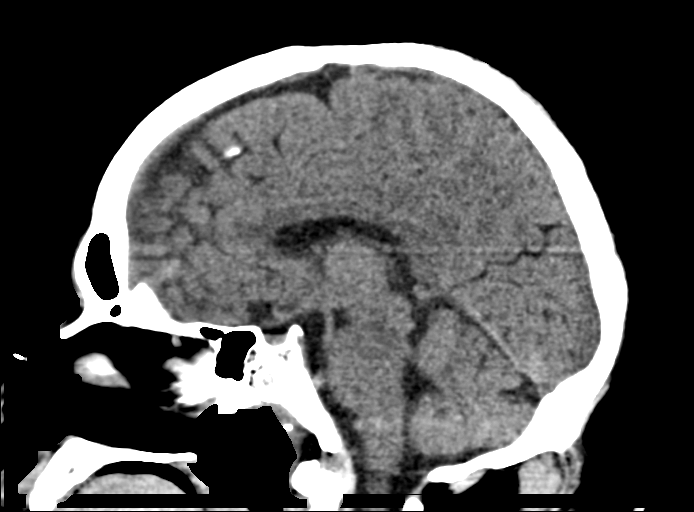
[im 45/67  brain]
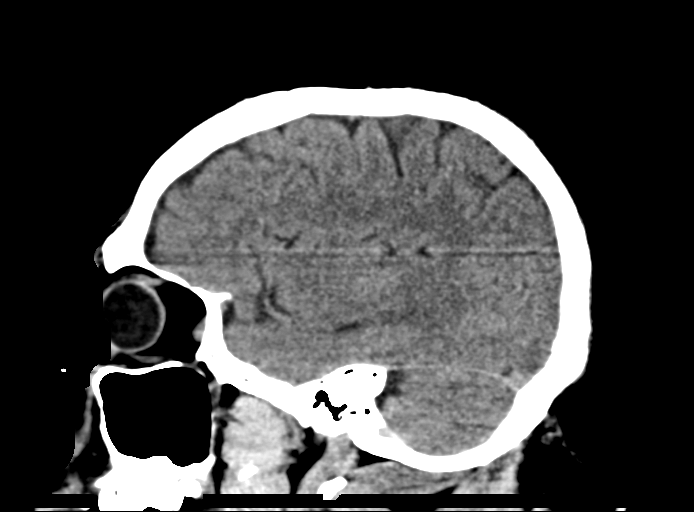

[15 of 47 positions shown; findings below may reference images not displayed]

FINDINGS: Brain: There is no mass, hemorrhage or extra-axial collection. The
size and configuration of the ventricles and extra-axial CSF spaces
are normal. The brain parenchyma is normal, without acute or chronic
infarction.

Vascular: No abnormal hyperdensity of the major intracranial
arteries or dural venous sinuses. No intracranial atherosclerosis.

Skull: The visualized skull base, calvarium and extracranial soft
tissues are normal.

Sinuses/Orbits: No fluid levels or advanced mucosal thickening of
the visualized paranasal sinuses. No mastoid or middle ear effusion.
The orbits are normal.
IMPRESSION: Normal head CT.

## 2022-12-03 ENCOUNTER — Ambulatory Visit (INDEPENDENT_AMBULATORY_CARE_PROVIDER_SITE_OTHER): Payer: BC Managed Care – PPO | Admitting: Internal Medicine

## 2022-12-03 ENCOUNTER — Encounter: Payer: Self-pay | Admitting: Internal Medicine

## 2022-12-03 VITALS — BP 136/72 | HR 86 | Temp 97.5°F | Ht 69.0 in | Wt 223.0 lb

## 2022-12-03 DIAGNOSIS — E7800 Pure hypercholesterolemia, unspecified: Secondary | ICD-10-CM | POA: Diagnosis not present

## 2022-12-03 DIAGNOSIS — Z0001 Encounter for general adult medical examination with abnormal findings: Secondary | ICD-10-CM | POA: Diagnosis not present

## 2022-12-03 DIAGNOSIS — E6609 Other obesity due to excess calories: Secondary | ICD-10-CM | POA: Diagnosis not present

## 2022-12-03 DIAGNOSIS — R7309 Other abnormal glucose: Secondary | ICD-10-CM

## 2022-12-03 DIAGNOSIS — Z6832 Body mass index (BMI) 32.0-32.9, adult: Secondary | ICD-10-CM

## 2022-12-03 NOTE — Assessment & Plan Note (Signed)
Encourage diet and exercise for weight loss 

## 2022-12-03 NOTE — Progress Notes (Signed)
Subjective:    Patient ID: Dylan Webster, male    DOB: 05-20-1976, 47 y.o.   MRN: [id number]  HPI  Patient presents to clinic today for his annual exam.  Flu: 05/2022 Tetanus: 06/2020 COVID: Moderna x 3 Colon screening: 02/2022 Vision screening: annually Dentist: as needed  Diet: He does eat meat. He consumes fruits and veggies. He does eat fried foods. He drinks mostly water. Exercise: None  Review of Systems     Past Medical History:  Diagnosis Date   Allergy    GERD (gastroesophageal reflux disease)    HTN (hypertension)    OSA (obstructive sleep apnea)    Osteoarthritis of knees, bilateral    Sleep apnea    uses CPAP    Current Outpatient Medications  Medication Sig Dispense Refill   acetaminophen (TYLENOL) 500 MG tablet Take 500 mg by mouth every 6 (six) hours as needed for mild pain or moderate pain.     amLODipine (NORVASC) 5 MG tablet Take 2 tablets (10 mg total) by mouth daily. 180 tablet 0   diclofenac Sodium (VOLTAREN ARTHRITIS PAIN) 1 % GEL Apply topically 4 (four) times daily as needed.     hydrOXYzine (ATARAX) 10 MG tablet Take 1 tablet (10 mg total) by mouth daily as needed. 90 tablet 0   ibuprofen (ADVIL) 200 MG tablet Take 200 mg by mouth daily as needed.     omeprazole (PRILOSEC) 20 MG capsule Take 1 capsule (20 mg total) by mouth daily. 90 capsule 0   No current facility-administered medications for this visit.    No Known Allergies  Family History  Problem Relation Age of Onset   Alcohol abuse Mother    Diabetes Mother    Alcohol abuse Father    Heart disease Father    Hyperlipidemia Maternal Aunt    Leukemia Maternal Aunt    Heart disease Paternal Uncle    Hypertension Paternal Uncle    Alcohol abuse Paternal Grandfather    Hyperlipidemia Paternal Grandfather    Heart disease Paternal Grandfather    Hypertension Paternal Grandfather    Diabetes Paternal Grandfather    Kidney disease Paternal Grandfather    Colon cancer Neg Hx     Stomach cancer Neg Hx    Esophageal cancer Neg Hx    Rectal cancer Neg Hx     Social History   Socioeconomic History   Marital status: Married    Spouse name: Not on file   Number of children: 4   Years of education: Not on file   Highest education level: Not on file  Occupational History   Occupation: maintenance  Tobacco Use   Smoking status: Former    Packs/day: 1.00    Years: 12.00    Total pack years: 12.00    Types: Cigarettes    Quit date: 12/22/2018    Years since quitting: 3.9   Smokeless tobacco: Never   Tobacco comments:    quit 12/2018  Vaping Use   Vaping Use: Never used  Substance and Sexual Activity   Alcohol use: No   Drug use: No   Sexual activity: Yes    Birth control/protection: None  Other Topics Concern   Not on file  Social History Narrative   ** Merged History Encounter **       Married, 4 children  Job: [employer]  Will start exercising regularly   Social Determinants of Health   Financial Resource Strain: Not on file  Food Insecurity:  Not on file  Transportation Needs: Not on file  Physical Activity: Not on file  Stress: Not on file  Social Connections: Not on file  Intimate Partner Violence: Not on file     Constitutional: Denies fever, malaise, fatigue, headache or abrupt weight changes.  HEENT: Denies eye pain, eye redness, ear pain, ringing in the ears, wax buildup, runny nose, nasal congestion, bloody nose, or sore throat. Respiratory: Denies difficulty breathing, shortness of breath, cough or sputum production.   Cardiovascular: Denies chest pain, chest tightness, palpitations or swelling in the hands or feet.  Gastrointestinal: Pt reports intermittent constipation. Denies abdominal pain, bloating,  diarrhea or blood in the stool.  GU: Denies urgency, frequency, pain with urination, burning sensation, blood in urine, odor or discharge. Musculoskeletal: Patient reports joint pain.  Denies decrease in range of  motion, difficulty with gait, muscle pain or joint swelling.  Skin: Denies redness, rashes, lesions or ulcercations.  Neurological: Denies dizziness, difficulty with memory, difficulty with speech or problems with balance and coordination.  Psych: Patient has a history of anxiety.  Denies depression, SI/HI.  No other specific complaints in a complete review of systems (except as listed in HPI above).  Objective:   Physical Exam   BP 136/72 (BP Location: Left Arm, Patient Position: Sitting, Cuff Size: Large)   Pulse 86   Temp (!) 97.5 F (36.4 C) (Temporal)   Ht '5\' 9"'$  (1.753 m)   Wt 223 lb (101.2 kg)   SpO2 100%   BMI 32.93 kg/m   Wt Readings from Last 3 Encounters:  11/09/22 225 lb 12.8 oz (102.4 kg)  10/30/22 225 lb (102.1 kg)  06/19/22 210 lb (95.3 kg)    General: Appears his stated age, obese, in NAD. Skin: Warm, dry and intact. No rashes noted. HEENT: Head: normal shape and size; Eyes: sclera white, no icterus, conjunctiva pink, PERRLA and EOMs intact;  Neck:  Neck supple, trachea midline. No masses, lumps or thyromegaly present.  Cardiovascular: Normal rate and rhythm. S1,S2 noted.  No murmur, rubs or gallops noted. No JVD or BLE edema.  Pulmonary/Chest: Normal effort and positive vesicular breath sounds. No respiratory distress. No wheezes, rales or ronchi noted.  Abdomen: Soft and nontender. Normal bowel sounds.  Musculoskeletal: Strength 5/5 BUE/BLE.  No difficulty with gait.  Neurological: Alert and oriented. Cranial nerves II-XII grossly intact. Coordination normal.  Psychiatric: Mood and affect normal. Behavior is normal. Judgment and thought content normal.     BMET    Component Value Date/Time   NA 140 06/05/2022 0815   NA 141 12/30/2019 1423   K 3.9 06/05/2022 0815   CL 107 06/05/2022 0815   CO2 26 06/05/2022 0815   GLUCOSE 95 06/05/2022 0815   BUN 20 06/05/2022 0815   BUN 16 12/30/2019 1423   CREATININE 0.80 06/05/2022 0815   CALCIUM 9.7 06/05/2022  0815   GFRNONAA >60 11/18/2021 0237   GFRAA 128 12/30/2019 1423    Lipid Panel     Component Value Date/Time   CHOL 151 06/05/2022 0815   TRIG 73 06/05/2022 0815   HDL 50 06/05/2022 0815   CHOLHDL 3.0 06/05/2022 0815   VLDL 19.4 06/09/2020 1553   LDLCALC 85 06/05/2022 0815    CBC    Component Value Date/Time   WBC 7.4 11/18/2021 0237   RBC 3.77 (L) 11/18/2021 0237   HGB 12.3 (L) 11/18/2021 0237   HCT 35.4 (L) 11/18/2021 0237   PLT 238 11/18/2021 0237   MCV 93.9 11/18/2021  0237   MCH 32.6 11/18/2021 0237   MCHC 34.7 11/18/2021 0237   RDW 12.1 11/18/2021 0237   LYMPHSABS 3.8 04/07/2021 0238   MONOABS 0.7 04/07/2021 0238   EOSABS 0.3 04/07/2021 0238   BASOSABS 0.0 04/07/2021 0238    Hgb A1C Lab Results  Component Value Date   HGBA1C 4.7 11/29/2021          Assessment & Plan:   Preventative Health Maintenance:  Encouraged him to get a flu shot in the fall Tetanus UTD Encouraged him to get his COVID booster Colon screening UTD Encouraged him to consume a balanced diet and exercise regimen Advised him to see an eye doctor and dentist annually We will check CBC, c-Met, lipid, A1c today  RTC in 6 months, follow-up chronic conditions Webb Silversmith, NP

## 2022-12-03 NOTE — Patient Instructions (Signed)
Health Maintenance, Male Adopting a healthy lifestyle and getting preventive care are important in promoting health and wellness. Ask your health care provider about: The right schedule for you to have regular tests and exams. Things you can do on your own to prevent diseases and keep yourself healthy. What should I know about diet, weight, and exercise? Eat a healthy diet  Eat a diet that includes plenty of vegetables, fruits, low-fat dairy products, and lean protein. Do not eat a lot of foods that are high in solid fats, added sugars, or sodium. Maintain a healthy weight Body mass index (BMI) is a measurement that can be used to identify possible weight problems. It estimates body fat based on height and weight. Your health care provider can help determine your BMI and help you achieve or maintain a healthy weight. Get regular exercise Get regular exercise. This is one of the most important things you can do for your health. Most adults should: Exercise for at least 150 minutes each week. The exercise should increase your heart rate and make you sweat (moderate-intensity exercise). Do strengthening exercises at least twice a week. This is in addition to the moderate-intensity exercise. Spend less time sitting. Even light physical activity can be beneficial. Watch cholesterol and blood lipids Have your blood tested for lipids and cholesterol at 46 years of age, then have this test every 5 years. You may need to have your cholesterol levels checked more often if: Your lipid or cholesterol levels are high. You are older than 46 years of age. You are at high risk for heart disease. What should I know about cancer screening? Many types of cancers can be detected early and may often be prevented. Depending on your health history and family history, you may need to have cancer screening at various ages. This may include screening for: Colorectal cancer. Prostate cancer. Skin cancer. Lung  cancer. What should I know about heart disease, diabetes, and high blood pressure? Blood pressure and heart disease High blood pressure causes heart disease and increases the risk of stroke. This is more likely to develop in people who have high blood pressure readings or are overweight. Talk with your health care provider about your target blood pressure readings. Have your blood pressure checked: Every 3-5 years if you are 18-39 years of age. Every year if you are 40 years old or older. If you are between the ages of 65 and 75 and are a current or former smoker, ask your health care provider if you should have a one-time screening for abdominal aortic aneurysm (AAA). Diabetes Have regular diabetes screenings. This checks your fasting blood sugar level. Have the screening done: Once every three years after age 45 if you are at a normal weight and have a low risk for diabetes. More often and at a younger age if you are overweight or have a high risk for diabetes. What should I know about preventing infection? Hepatitis B If you have a higher risk for hepatitis B, you should be screened for this virus. Talk with your health care provider to find out if you are at risk for hepatitis B infection. Hepatitis C Blood testing is recommended for: Everyone born from 1945 through 1965. Anyone with known risk factors for hepatitis C. Sexually transmitted infections (STIs) You should be screened each year for STIs, including gonorrhea and chlamydia, if: You are sexually active and are younger than 46 years of age. You are older than 46 years of age and your   health care provider tells you that you are at risk for this type of infection. Your sexual activity has changed since you were last screened, and you are at increased risk for chlamydia or gonorrhea. Ask your health care provider if you are at risk. Ask your health care provider about whether you are at high risk for HIV. Your health care provider  may recommend a prescription medicine to help prevent HIV infection. If you choose to take medicine to prevent HIV, you should first get tested for HIV. You should then be tested every 3 months for as long as you are taking the medicine. Follow these instructions at home: Alcohol use Do not drink alcohol if your health care provider tells you not to drink. If you drink alcohol: Limit how much you have to 0-2 drinks a day. Know how much alcohol is in your drink. In the [place] one drink equals one 12 oz bottle of beer (355 mL), one 5 oz glass of wine (148 mL), or one 1 oz glass of hard liquor (44 mL). Lifestyle Do not use any products that contain nicotine or tobacco. These products include cigarettes, chewing tobacco, and vaping devices, such as e-cigarettes. If you need help quitting, ask your health care provider. Do not use street drugs. Do not share needles. Ask your health care provider for help if you need support or information about quitting drugs. General instructions Schedule regular health, dental, and eye exams. Stay current with your vaccines. Tell your health care provider if: You often feel depressed. You have ever been abused or do not feel safe at home. Summary Adopting a healthy lifestyle and getting preventive care are important in promoting health and wellness. Follow your health care provider's instructions about healthy diet, exercising, and getting tested or screened for diseases. Follow your health care provider's instructions on monitoring your cholesterol and blood pressure. This information is not intended to replace advice given to you by your health care provider. Make sure you discuss any questions you have with your health care provider. Document Revised: 02/13/2021 Document Reviewed: 02/13/2021 Elsevier Patient Education  2023 Elsevier Inc.  

## 2022-12-06 LAB — CBC
HCT: 37.5 % — ABNORMAL LOW (ref 38.5–50.0)
Hemoglobin: 12.9 g/dL — ABNORMAL LOW (ref 13.2–17.1)
MCH: 31.9 pg (ref 27.0–33.0)
MCHC: 34.4 g/dL (ref 32.0–36.0)
MCV: 92.6 fL (ref 80.0–100.0)
MPV: 10.1 fL (ref 7.5–12.5)
Platelets: 252 10*3/uL (ref 140–400)
RBC: 4.05 10*6/uL — ABNORMAL LOW (ref 4.20–5.80)
RDW: 11.7 % (ref 11.0–15.0)
WBC: 7.3 10*3/uL (ref 3.8–10.8)

## 2022-12-06 LAB — LIPID PANEL
Cholesterol: 181 mg/dL (ref <200)
HDL: 56 mg/dL (ref >=40)
LDL Cholesterol (Calc): 110 mg/dL (calc) — ABNORMAL HIGH
Non-HDL Cholesterol (Calc): 125 mg/dL (calc) (ref <130)
Total CHOL/HDL Ratio: 3.2 (calc) (ref <5.0)
Triglycerides: 63 mg/dL (ref <150)

## 2022-12-06 LAB — HEMOGLOBIN A1C
Hgb A1c MFr Bld: 5 % of total Hgb (ref <5.7)
Mean Plasma Glucose: 97 mg/dL
eAG (mmol/L): 5.4 mmol/L

## 2022-12-06 LAB — COMPLETE METABOLIC PANEL WITH GFR
AG Ratio: 1.7 (calc) (ref 1.0–2.5)
ALT: 11 U/L (ref 9–46)
AST: 9 U/L — ABNORMAL LOW (ref 10–40)
Albumin: 4.5 g/dL (ref 3.6–5.1)
Alkaline phosphatase (APISO): 91 U/L (ref 36–130)
BUN: 20 mg/dL (ref 7–25)
CO2: 26 mmol/L (ref 20–32)
Calcium: 9.6 mg/dL (ref 8.6–10.3)
Chloride: 106 mmol/L (ref 98–110)
Creat: 0.79 mg/dL (ref 0.60–1.29)
Globulin: 2.7 g/dL (calc) (ref 1.9–3.7)
Glucose, Bld: 109 mg/dL — ABNORMAL HIGH (ref 65–99)
Potassium: 3.9 mmol/L (ref 3.5–5.3)
Sodium: 140 mmol/L (ref 135–146)
Total Bilirubin: 0.7 mg/dL (ref 0.2–1.2)
Total Protein: 7.2 g/dL (ref 6.1–8.1)
eGFR: 111 mL/min/{1.73_m2} (ref >=60)

## 2022-12-06 LAB — TEST AUTHORIZATION 2

## 2022-12-06 LAB — MICROALBUMIN / CREATININE URINE RATIO
Creatinine, Urine: 91 mg/dL (ref 20–320)
Microalb Creat Ratio: 59 mcg/mg creat — ABNORMAL HIGH (ref <30)
Microalb, Ur: 5.4 mg/dL

## 2022-12-19 ENCOUNTER — Encounter: Payer: Self-pay | Admitting: Internal Medicine

## 2023-02-08 ENCOUNTER — Other Ambulatory Visit: Payer: Self-pay | Admitting: Internal Medicine

## 2023-02-08 MED ORDER — OMEPRAZOLE 20 MG PO CPDR: 20.0000 mg | DELAYED_RELEASE_CAPSULE | Freq: Every day | ORAL | 0 refills | Status: DC

## 2023-02-08 MED ORDER — AMLODIPINE BESYLATE 5 MG PO TABS: 10.0000 mg | ORAL_TABLET | Freq: Every day | ORAL | 0 refills | Status: DC

## 2023-03-08 NOTE — Progress Notes (Addendum)
HPI M former smoker followed for OSA, complicated by HBP, Hypercholesterolemia,  NPSG 04/01/2019- AHI 93.3/ hr, desaturation to 75%, body weight 244 lbs  -------------------------------------------------------------------------------------------    03/09/22- 46 yoM former smoker followed for OSA, complicated by HTN, GERD, Obesity, Hypercholesterolemia, Osteoarthritis,  CPAP auto 10-18/ [facility] [phone]) Download-  Body weight today- Covid vax-3 Moderna Flu vax-had -----Patient would like to talk about CPAP mask states that sometimes it leaks and then if he tightens it it hurts the back of his head. His DME company needs the machine serial number so they can install a card or download software and we will try to expedite that.  He is well pleased with his CPAP except for mask comfort.  Daytime sleepiness is markedly improved and he feels safe driving. Getting help for GERD and for osteoarthritis in his knees, blood pressure but otherwise no heart or lung problems.  03/11/23- 47 yoM former smoker followed for OSA, complicated by HTN, GERD, Obesity, Hypercholesterolemia, Osteoarthritis,  CPAP auto 10-18/ [facility] [phone]) Download-  Body weight today- LOV Cobb, NP 11/09/22- Prop clearance R TKR. Good CPAP compliance and control. He provides download onto MyChart documenting 100% compliance with AHI 0.8/hour.  He says he definitely sleeps better with CPAP.  Wife would scold him if he did not wear it. He is trying to keep weight down.  05/16/23- Addendum- Pulmonary status clear for necessary surgery. Note that our NP cleared for surgery also, in February.  ROS-see HPI   + = positive Constitutional:    weight loss, night sweats, fevers, chills,+ fatigue, lassitude. HEENT:    headaches, difficulty swallowing, tooth/dental problems, sore throat,       sneezing, itching, ear ache, nasal congestion, post nasal drip, snoring CV:    chest pain, orthopnea, PND, swelling in lower  extremities, anasarca,                                   dizziness, palpitations Resp:   shortness of breath with exertion or at rest.                productive cough,   non-productive cough, coughing up of blood.              change in color of mucus.  wheezing.   Skin:    rash or lesions. GI:  + heartburn, indigestion, abdominal pain, nausea, vomiting, diarrhea,                 change in bowel habits, loss of appetite GU: dysuria, change in color of urine, no urgency or frequency.   flank pain. MS:  + joint pain, stiffness, decreased range of motion, back pain. Neuro-     nothing unusual Psych:  change in mood or affect.  depression or anxiety.   memory loss.  OBJ- Physical Exam General- Alert, Oriented, Affect-appropriate, Distress- none acute, + overweight Skin- rash-none, lesions- none, excoriation- none Lymphadenopathy- none Head- atraumatic            Eyes- Gross vision intact, PERRLA, conjunctivae and secretions clear            Ears- Hearing, canals-normal            Nose- Clear, no-Septal dev, mucus, polyps, erosion, perforation             Throat- Mallampati IV, mucosa clear , drainage- none, tonsils- atrophic, + teeth Neck- flexible ,  trachea midline, no stridor , thyroid nl, carotid no bruit Chest - symmetrical excursion , unlabored           Heart/CV- RRR , no murmur , no gallop  , no rub, nl s1 s2                           - JVD- none , edema- none, stasis changes- none, varices- none           Lung- clear to P&A, wheeze- none, cough- none , dullness-none, rub- none           Chest wall-  Abd-  Br/ Gen/ Rectal- Not done, not indicated Extrem- cyanosis- none, clubbing, none, atrophy- none, strength- nl Neuro- grossly intact to observation

## 2023-03-11 ENCOUNTER — Ambulatory Visit: Payer: BC Managed Care – PPO | Admitting: Internal Medicine

## 2023-03-11 ENCOUNTER — Encounter: Payer: Self-pay | Admitting: Internal Medicine

## 2023-03-11 VITALS — BP 126/74 | HR 90 | Temp 98.6°F | Ht 68.0 in | Wt 223.4 lb

## 2023-03-11 DIAGNOSIS — G4733 Obstructive sleep apnea (adult) (pediatric): Secondary | ICD-10-CM | POA: Diagnosis not present

## 2023-03-11 DIAGNOSIS — I10 Essential (primary) hypertension: Secondary | ICD-10-CM

## 2023-03-11 NOTE — Telephone Encounter (Signed)
Please review attached CPAP DL, thanks!

## 2023-03-11 NOTE — Assessment & Plan Note (Signed)
He understands association between hypertension and inadequately controlled OSA.  Doing well.

## 2023-03-11 NOTE — Patient Instructions (Signed)
Order- DME [facility]- continue CPAP auto 10-18, mask of choice, humidifier, supplies, AirView/ card  Please call if we can help

## 2023-03-11 NOTE — Assessment & Plan Note (Signed)
Benefits from CPAP with good compliance and control Plan-continue auto 10-18 

## 2023-05-16 ENCOUNTER — Telehealth: Payer: Self-pay | Admitting: Internal Medicine

## 2023-05-16 ENCOUNTER — Ambulatory Visit: Payer: BC Managed Care – PPO | Admitting: Adult Health

## 2023-05-16 ENCOUNTER — Telehealth: Payer: Self-pay | Admitting: *Deleted

## 2023-05-16 NOTE — Telephone Encounter (Signed)
Called patient back and spoke with patient, advised that he did not need to come into the office as he was just seen by Dr. Maple Hudson in June of this year and he is handling his surgical clearance.  He verbalized understanding.  Nothing further needed.

## 2023-05-16 NOTE — Telephone Encounter (Signed)
Fax received from Dr. Margarita Rana with [facility] to perform a Left Total Knee Replacement on patient.  Patient needs surgery clearance. Surgery is Pending. Patient was seen on 03/11/23. Office protocol is a risk assessment can be sent to surgeon if patient has been seen in 60 days or less.   Sending to Dr. Maple Hudson for risk assessment or recommendations if patient needs to be seen in office prior to surgical procedure.

## 2023-05-16 NOTE — Telephone Encounter (Signed)
I addended my June office note- clear for knee surgery

## 2023-05-16 NOTE — Telephone Encounter (Signed)
ATC x1.  LVM to return call.  When he returns call, I wanted to know if he plans to come for his appointment this afternoon or if he would like to reschedule. He cannot do a virtual because he is for surgical clearance.

## 2023-05-21 NOTE — Telephone Encounter (Signed)
OV notes and clearance form have been faxed back to [facility]. Nothing further needed at this time.  

## 2023-05-27 ENCOUNTER — Encounter: Payer: Self-pay | Admitting: Internal Medicine

## 2023-05-27 ENCOUNTER — Ambulatory Visit: Payer: BC Managed Care – PPO | Admitting: Internal Medicine

## 2023-05-27 VITALS — BP 156/94 | HR 81 | Temp 97.1°F | Wt 214.0 lb

## 2023-05-27 DIAGNOSIS — Z01818 Encounter for other preprocedural examination: Secondary | ICD-10-CM | POA: Diagnosis not present

## 2023-05-27 DIAGNOSIS — Z0289 Encounter for other administrative examinations: Secondary | ICD-10-CM

## 2023-05-27 DIAGNOSIS — G4733 Obstructive sleep apnea (adult) (pediatric): Secondary | ICD-10-CM

## 2023-05-27 DIAGNOSIS — I10 Essential (primary) hypertension: Secondary | ICD-10-CM

## 2023-05-27 DIAGNOSIS — R7309 Other abnormal glucose: Secondary | ICD-10-CM

## 2023-05-27 DIAGNOSIS — Z6832 Body mass index (BMI) 32.0-32.9, adult: Secondary | ICD-10-CM

## 2023-05-27 DIAGNOSIS — E6609 Other obesity due to excess calories: Secondary | ICD-10-CM

## 2023-05-27 DIAGNOSIS — F988 Other specified behavioral and emotional disorders with onset usually occurring in childhood and adolescence: Secondary | ICD-10-CM | POA: Insufficient documentation

## 2023-05-27 DIAGNOSIS — K219 Gastro-esophageal reflux disease without esophagitis: Secondary | ICD-10-CM

## 2023-05-27 DIAGNOSIS — E7800 Pure hypercholesterolemia, unspecified: Secondary | ICD-10-CM

## 2023-05-27 DIAGNOSIS — F419 Anxiety disorder, unspecified: Secondary | ICD-10-CM

## 2023-05-27 DIAGNOSIS — M170 Bilateral primary osteoarthritis of knee: Secondary | ICD-10-CM

## 2023-05-27 NOTE — Assessment & Plan Note (Signed)
Preparing for left total knee replacement

## 2023-05-27 NOTE — Assessment & Plan Note (Signed)
Elevated today Reinforced DASH diet and exercise for weight loss C-Met today

## 2023-05-27 NOTE — Assessment & Plan Note (Signed)
C-Met and lipid profile today Encouraged to consume a low-fat diet 

## 2023-05-27 NOTE — Assessment & Plan Note (Signed)
On methylphenidate which is likely contributing to elevated blood pressure He will continue to follow with psychiatry

## 2023-05-27 NOTE — Progress Notes (Signed)
Subjective:    Patient ID: Dylan Webster, male    DOB: Dec 13, 1975, 47 y.o.   MRN: [id number]  HPI  Patient presents to clinic today for follow-up of chronic conditions.  HTN: His BP today is 164/92.  He is taking amlodipine as prescribed.  ECG from 10/2022 reviewed.  OSA: He averages 6 hours of sleep per night with the use of CPAP.  Sleep study from 03/2019 reviewed.  Anxiety: Intermittent.  He takes hydroxyzine as needed with good relief of symptoms.  He is not currently seeing a therapist.  He denies depression, SI/HI.  Anemia: His last H/H was 12.9/37.5, 11/2022.  He is not taking any oral iron at this time.  He does not follow with hematology.  GERD: Triggered by red meat and laying down after eating.  He denies breakthrough on omeprazole.  There is no upper GI on file.  HLD: His last LDL was 110, triglycerides 63, 11/2022.  He is not taking a cholesterol-lowering medication at this time.  He does not check his sugars.  OA: Mainly in his knees. He takes naproxen and tylenol OTC as needed. He has had a right knee replacement.  He is scheduled to have a left knee replacement by Dr. Eulah Pont.  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 Managed with methylphenidate. He follows with psychiatry.  Review of Systems     Past Medical History:  Diagnosis Date   Allergy    GERD (gastroesophageal reflux disease)    HTN (hypertension)    OSA (obstructive sleep apnea)    Osteoarthritis of knees, bilateral    Sleep apnea    uses CPAP    Current Outpatient Medications  Medication Sig Dispense Refill   acetaminophen (TYLENOL) 500 MG tablet Take 500 mg by mouth every 6 (six) hours as needed for mild pain or moderate pain.     amLODipine (NORVASC) 5 MG tablet Take 2 tablets (10 mg total) by mouth daily. 180 tablet 0   diclofenac Sodium (VOLTAREN ARTHRITIS PAIN) 1 % GEL Apply topically 4 (four) times daily as needed.     hydrOXYzine (ATARAX) 10 MG tablet Take 1 tablet (10 mg total) by mouth daily as  needed. 90 tablet 0   ibuprofen (ADVIL) 200 MG tablet Take 200 mg by mouth daily as needed.     methylphenidate 36 MG PO CR tablet Take 36 mg by mouth daily.     omeprazole (PRILOSEC) 20 MG capsule Take 1 capsule (20 mg total) by mouth daily. 90 capsule 0   No current facility-administered medications for this visit.    No Known Allergies  Family History  Problem Relation Age of Onset   Alcohol abuse Mother    Diabetes Mother    Alcohol abuse Father    Heart disease Father    Hyperlipidemia Maternal Aunt    Leukemia Maternal Aunt    Heart disease Paternal Uncle    Hypertension Paternal Uncle    Alcohol abuse Paternal Grandfather    Hyperlipidemia Paternal Grandfather    Heart disease Paternal Grandfather    Hypertension Paternal Grandfather    Diabetes Paternal Grandfather    Kidney disease Paternal Grandfather    Colon cancer Neg Hx    Stomach cancer Neg Hx    Esophageal cancer Neg Hx    Rectal cancer Neg Hx     Social History   Socioeconomic History   Marital status: Married    Spouse name: Not on file   Number of children: 4  Years of education: Not on file   Highest education level: GED or equivalent  Occupational History   Occupation: maintenance  Tobacco Use   Smoking status: Former    Current packs/day: 0.00    Average packs/day: 1 pack/day for 12.0 years (12.0 ttl pk-yrs)    Types: Cigarettes    Start date: 12/22/2006    Quit date: 12/22/2018    Years since quitting: 4.4   Smokeless tobacco: Never   Tobacco comments:    quit 12/2018  Vaping Use   Vaping status: Never Used  Substance and Sexual Activity   Alcohol use: No   Drug use: No   Sexual activity: Yes    Birth control/protection: None  Other Topics Concern   Not on file  Social History Narrative   ** Merged History Encounter **       Married, 4 children  Job: [name]  Will start exercising regularly   Social Determinants of Health   Financial Resource Strain: Low  Risk  (05/24/2023)   Overall Financial Resource Strain (CARDIA)    Difficulty of Paying Living Expenses: Not hard at all  Food Insecurity: No Food Insecurity (05/24/2023)   Hunger Vital Sign    Worried About Running Out of Food in the Last Year: Never true    Ran Out of Food in the Last Year: Never true  Transportation Needs: No Transportation Needs (05/24/2023)   PRAPARE - Administrator, Civil Service (Medical): No    Lack of Transportation (Non-Medical): No  Physical Activity: Sufficiently Active (05/24/2023)   Exercise Vital Sign    Days of Exercise per Week: 5 days    Minutes of Exercise per Session: 60 min  Stress: No Stress Concern Present (05/24/2023)   [employer] of Occupational Health - Occupational Stress Questionnaire    Feeling of Stress : Not at all  Social Connections: Moderately Integrated (05/24/2023)   Social Connection and Isolation Panel [NHANES]    Frequency of Communication with Friends and Family: More than three times a week    Frequency of Social Gatherings with Friends and Family: Twice a week    Attends Religious Services: More than 4 times per year    Active Member of [employer] or Organizations: No    Attends Engineer, structural: Not on file    Marital Status: Married  Intimate Partner Violence: Unknown (01/12/2022)   Received from [facility]   HITS    Physically Hurt: Not on file    Insult or Talk Down To: Not on file    Threaten Physical Harm: Not on file    Scream or Curse: Not on file     Constitutional: Denies fever, malaise, fatigue, headache or abrupt weight changes.  HEENT: Denies eye pain, eye redness, ear pain, ringing in the ears, wax buildup, runny nose, nasal congestion, bloody nose, or sore throat. Respiratory: Denies difficulty breathing, shortness of breath, cough or sputum production.   Cardiovascular: Denies chest pain, chest tightness, palpitations or swelling in the hands or feet.  Gastrointestinal: Denies  abdominal pain, bloating, constipation, diarrhea or blood in the stool.  GU: Denies urgency, frequency, pain with urination, burning sensation, blood in urine, odor or discharge. Musculoskeletal: Patient reports right knee pain.  Denies decrease in range of motion, difficulty with gait, muscle pain or joint swelling.  Skin: Denies redness, rashes, lesions or ulcercations.  Neurological: Pt reports inattention. Denies dizziness, difficulty with memory, difficulty with speech or problems with balance and  coordination.  Psych: Patient has a history of anxiety.  Denies depression, SI/HI.  No other specific complaints in a complete review of systems (except as listed in HPI above).  Objective:   Physical Exam  BP (!) 156/94 (BP Location: Left Arm, Patient Position: Sitting, Cuff Size: Large)   Pulse 81   Temp (!) 97.1 F (36.2 C) (Temporal)   Wt 214 lb (97.1 kg)   SpO2 97%   BMI 32.54 kg/m   Wt Readings from Last 3 Encounters:  03/11/23 223 lb 6.4 oz (101.3 kg)  12/03/22 223 lb (101.2 kg)  11/09/22 225 lb 12.8 oz (102.4 kg)    General: Appears his stated age, obese, in NAD. Skin: Warm, dry and intact.  HEENT: Head: normal shape and size; Eyes: sclera white, no icterus, conjunctiva pink, PERRLA and EOMs intact;  Cardiovascular: Normal rate and rhythm. S1,S2 noted.  No murmur, rubs or gallops noted. No JVD or BLE edema.  Pulmonary/Chest: Normal effort and positive vesicular breath sounds. No respiratory distress. No wheezes, rales or ronchi noted.  Abdomen: Normal bowel sounds.  Musculoskeletal: Joint enlargement with scarring noted over the right knee.  Strength 5/5 BLE.  No difficulty with gait.  Neurological: Alert and oriented. Coordination normal.  Psychiatric: Mood and affect normal. Behavior is normal. Judgment and thought content normal.    BMET    Component Value Date/Time   NA 140 12/03/2022 0822   NA 141 12/30/2019 1423   K 3.9 12/03/2022 0822   CL 106 12/03/2022 0822    CO2 26 12/03/2022 0822   GLUCOSE 109 (H) 12/03/2022 0822   BUN 20 12/03/2022 0822   BUN 16 12/30/2019 1423   CREATININE 0.79 12/03/2022 0822   CALCIUM 9.6 12/03/2022 0822   GFRNONAA >60 11/18/2021 0237   GFRAA 128 12/30/2019 1423    Lipid Panel     Component Value Date/Time   CHOL 181 12/03/2022 0822   TRIG 63 12/03/2022 0822   HDL 56 12/03/2022 0822   CHOLHDL 3.2 12/03/2022 0822   VLDL 19.4 06/09/2020 1553   LDLCALC 110 (H) 12/03/2022 0822    CBC    Component Value Date/Time   WBC 7.3 12/03/2022 0822   RBC 4.05 (L) 12/03/2022 0822   HGB 12.9 (L) 12/03/2022 0822   HCT 37.5 (L) 12/03/2022 0822   PLT 252 12/03/2022 0822   MCV 92.6 12/03/2022 0822   MCH 31.9 12/03/2022 0822   MCHC 34.4 12/03/2022 0822   RDW 11.7 12/03/2022 0822   LYMPHSABS 3.8 04/07/2021 0238   MONOABS 0.7 04/07/2021 0238   EOSABS 0.3 04/07/2021 0238   BASOSABS 0.0 04/07/2021 0238    Hgb A1C Lab Results  Component Value Date   HGBA1C 5.0 12/03/2022           Assessment & Plan:   Preoperative screening for osteoarthritis of left knee:   No indication for ECG as he had 1 done 10/2022  Will check CBC, c-Met, lipid, A1c today  Will fill out form for preoperative screening and fax back once labs are reviewed   RTC in 6 months for your annual exam Nicki Reaper, NP

## 2023-05-27 NOTE — Assessment & Plan Note (Signed)
Continue hydroxyzine as needed 

## 2023-05-27 NOTE — Assessment & Plan Note (Signed)
Avoid foods that trigger reflux Encourage weight loss as this can help reduce reflux symptoms Continue omeprazole 

## 2023-05-27 NOTE — Assessment & Plan Note (Signed)
Encourage weight loss as this can help reduce sleep apnea symptoms Continue CPAP use 

## 2023-05-27 NOTE — Patient Instructions (Signed)

## 2023-05-27 NOTE — Assessment & Plan Note (Signed)
Encourage diet and exercise for weight loss 

## 2023-05-28 LAB — COMPLETE METABOLIC PANEL WITH GFR
AG Ratio: 1.6 (calc) (ref 1.0–2.5)
ALT: 9 U/L (ref 9–46)
AST: 11 U/L (ref 10–40)
Albumin: 4.4 g/dL (ref 3.6–5.1)
Alkaline phosphatase (APISO): 72 U/L (ref 36–130)
BUN: 17 mg/dL (ref 7–25)
CO2: 26 mmol/L (ref 20–32)
Calcium: 9.5 mg/dL (ref 8.6–10.3)
Chloride: 108 mmol/L (ref 98–110)
Creat: 0.79 mg/dL (ref 0.60–1.29)
Globulin: 2.7 g/dL (ref 1.9–3.7)
Glucose, Bld: 88 mg/dL (ref 65–139)
Potassium: 4.2 mmol/L (ref 3.5–5.3)
Sodium: 139 mmol/L (ref 135–146)
Total Bilirubin: 0.9 mg/dL (ref 0.2–1.2)
Total Protein: 7.1 g/dL (ref 6.1–8.1)
eGFR: 111 mL/min/{1.73_m2} (ref >=60)

## 2023-05-28 LAB — CBC
HCT: 38.7 % (ref 38.5–50.0)
Hemoglobin: 12.7 g/dL — ABNORMAL LOW (ref 13.2–17.1)
MCH: 31.8 pg (ref 27.0–33.0)
MCHC: 32.8 g/dL (ref 32.0–36.0)
MCV: 97 fL (ref 80.0–100.0)
MPV: 10.6 fL (ref 7.5–12.5)
Platelets: 235 10*3/uL (ref 140–400)
RBC: 3.99 10*6/uL — ABNORMAL LOW (ref 4.20–5.80)
RDW: 11.7 % (ref 11.0–15.0)
WBC: 5.6 10*3/uL (ref 3.8–10.8)

## 2023-05-28 LAB — LIPID PANEL
Cholesterol: 170 mg/dL (ref <200)
HDL: 55 mg/dL (ref >=40)
LDL Cholesterol (Calc): 102 mg/dL — ABNORMAL HIGH
Non-HDL Cholesterol (Calc): 115 mg/dL (ref <130)
Total CHOL/HDL Ratio: 3.1 (calc) (ref <5.0)
Triglycerides: 50 mg/dL (ref <150)

## 2023-05-28 LAB — HEMOGLOBIN A1C
Hgb A1c MFr Bld: 4.9 %{Hb} (ref <5.7)
Mean Plasma Glucose: 94 mg/dL
eAG (mmol/L): 5.2 mmol/L

## 2023-06-21 ENCOUNTER — Other Ambulatory Visit: Payer: Self-pay | Admitting: Internal Medicine

## 2023-06-24 NOTE — Telephone Encounter (Signed)
Requested Prescriptions  Pending Prescriptions Disp Refills   omeprazole (PRILOSEC) 20 MG capsule [Pharmacy Med Name: OMEPRAZOLE 20 MG CAP 20 Capsule] 90 capsule 1    Sig: TAKE 1 CAPSULE (20 MG TOTAL) BY MOUTH DAILY.     Gastroenterology: Proton Pump Inhibitors Passed - 06/21/2023 10:50 AM      Passed - Valid encounter within last 12 months    Recent Outpatient Visits           4 weeks ago Pure hypercholesterolemia   [place] [facility] [place], Salvadore Oxford, NP   6 months ago Encounter for general adult medical examination with abnormal findings   [place] [facility] [place], Salvadore Oxford, NP   7 months ago Primary osteoarthritis of right knee   [place] [facility] [place], Salvadore Oxford, NP   11 months ago Hypertension, essential   [place] [facility] D[place], Jackelyn Poling, RPH-CPP   1 year ago Hypertension, essential   [place] [facility] [place], Salvadore Oxford, NP       Future Appointments             In 5 months Baity, Salvadore Oxford, NP [facility], [facility]

## 2023-08-05 ENCOUNTER — Other Ambulatory Visit: Payer: Self-pay | Admitting: Internal Medicine

## 2023-08-06 NOTE — Telephone Encounter (Signed)
Requested Prescriptions  Pending Prescriptions Disp Refills   amLODipine (NORVASC) 5 MG tablet [Pharmacy Med Name: AMLODIPINE BESYLATE 5 MG TA 5 Tablet] 180 tablet 0    Sig: TAKE 2 TABLETS (10 MG TOTAL) BY MOUTH DAILY.     Cardiovascular: Calcium Channel Blockers 2 Failed - 08/05/2023 10:08 AM      Failed - Last BP in normal range    BP Readings from Last 1 Encounters:  05/27/23 (!) 156/94         Passed - Last Heart Rate in normal range    Pulse Readings from Last 1 Encounters:  05/27/23 81         Passed - Valid encounter within last 6 months    Recent Outpatient Visits           2 months ago Pure hypercholesterolemia   [place] [facility] [place], Salvadore Oxford, NP   8 months ago Encounter for general adult medical examination with abnormal findings   [place] [facility] [place], Salvadore Oxford, NP   9 months ago Primary osteoarthritis of right knee   [place] [facility] [place], Salvadore Oxford, NP   1 year ago Hypertension, essential   [place] [facility] [place], Jackelyn Poling, RPH-CPP   1 year ago Hypertension, essential   [place] [facility] [place], Salvadore Oxford, NP       Future Appointments             In 4 months Baity, Salvadore Oxford, NP [facility], [facility]

## 2023-12-05 ENCOUNTER — Ambulatory Visit (INDEPENDENT_AMBULATORY_CARE_PROVIDER_SITE_OTHER): Payer: 59 | Admitting: Internal Medicine

## 2023-12-05 VITALS — BP 137/78 | Ht 68.0 in | Wt 218.0 lb

## 2023-12-05 DIAGNOSIS — F5101 Primary insomnia: Secondary | ICD-10-CM | POA: Diagnosis not present

## 2023-12-05 DIAGNOSIS — E66811 Obesity, class 1: Secondary | ICD-10-CM

## 2023-12-05 DIAGNOSIS — R739 Hyperglycemia, unspecified: Secondary | ICD-10-CM | POA: Diagnosis not present

## 2023-12-05 DIAGNOSIS — Z0001 Encounter for general adult medical examination with abnormal findings: Secondary | ICD-10-CM

## 2023-12-05 DIAGNOSIS — I10 Essential (primary) hypertension: Secondary | ICD-10-CM

## 2023-12-05 DIAGNOSIS — G4700 Insomnia, unspecified: Secondary | ICD-10-CM | POA: Insufficient documentation

## 2023-12-05 DIAGNOSIS — Z23 Encounter for immunization: Secondary | ICD-10-CM

## 2023-12-05 DIAGNOSIS — E6609 Other obesity due to excess calories: Secondary | ICD-10-CM

## 2023-12-05 DIAGNOSIS — Z6833 Body mass index (BMI) 33.0-33.9, adult: Secondary | ICD-10-CM

## 2023-12-05 DIAGNOSIS — E7800 Pure hypercholesterolemia, unspecified: Secondary | ICD-10-CM | POA: Diagnosis not present

## 2023-12-05 MED ORDER — AMLODIPINE-OLMESARTAN 10-20 MG PO TABS: 1.0000 | ORAL_TABLET | Freq: Every day | ORAL | 1 refills | Status: DC

## 2023-12-05 NOTE — Assessment & Plan Note (Signed)
 Uncontrolled on amlodipine 10 mg, will discontinue Rx for amlodipine-olmesartan 10-20 mg daily Reinforced DASH diet and exercise for weight loss

## 2023-12-05 NOTE — Patient Instructions (Signed)
 Health Maintenance, Male  Adopting a healthy lifestyle and getting preventive care are important in promoting health and wellness. Ask your health care provider about:  The right schedule for you to have regular tests and exams.  Things you can do on your own to prevent diseases and keep yourself healthy.  What should I know about diet, weight, and exercise?  Eat a healthy diet    Eat a diet that includes plenty of vegetables, fruits, low-fat dairy products, and lean protein.  Do not eat a lot of foods that are high in solid fats, added sugars, or sodium.  Maintain a healthy weight  Body mass index (BMI) is a measurement that can be used to identify possible weight problems. It estimates body fat based on height and weight. Your health care provider can help determine your BMI and help you achieve or maintain a healthy weight.  Get regular exercise  Get regular exercise. This is one of the most important things you can do for your health. Most adults should:  Exercise for at least 150 minutes each week. The exercise should increase your heart rate and make you sweat (moderate-intensity exercise).  Do strengthening exercises at least twice a week. This is in addition to the moderate-intensity exercise.  Spend less time sitting. Even light physical activity can be beneficial.  Watch cholesterol and blood lipids  Have your blood tested for lipids and cholesterol at 47 years of age, then have this test every 5 years.  You may need to have your cholesterol levels checked more often if:  Your lipid or cholesterol levels are high.  You are older than 47 years of age.  You are at high risk for heart disease.  What should I know about cancer screening?  Many types of cancers can be detected early and may often be prevented. Depending on your health history and family history, you may need to have cancer screening at various ages. This may include screening for:  Colorectal cancer.  Prostate cancer.  Skin cancer.  Lung  cancer.  What should I know about heart disease, diabetes, and high blood pressure?  Blood pressure and heart disease  High blood pressure causes heart disease and increases the risk of stroke. This is more likely to develop in people who have high blood pressure readings or are overweight.  Talk with your health care provider about your target blood pressure readings.  Have your blood pressure checked:  Every 3-5 years if you are 9-95 years of age.  Every year if you are 85 years old or older.  If you are between the ages of 29 and 29 and are a current or former smoker, ask your health care provider if you should have a one-time screening for abdominal aortic aneurysm (AAA).  Diabetes  Have regular diabetes screenings. This checks your fasting blood sugar level. Have the screening done:  Once every three years after age 23 if you are at a normal weight and have a low risk for diabetes.  More often and at a younger age if you are overweight or have a high risk for diabetes.  What should I know about preventing infection?  Hepatitis B  If you have a higher risk for hepatitis B, you should be screened for this virus. Talk with your health care provider to find out if you are at risk for hepatitis B infection.  Hepatitis C  Blood testing is recommended for:  Everyone born from [birth year] through 1964.  Anyone  with known risk factors for hepatitis C.  Sexually transmitted infections (STIs)  You should be screened each year for STIs, including gonorrhea and chlamydia, if:  You are sexually active and are younger than 47 years of age.  You are older than 47 years of age and your health care provider tells you that you are at risk for this type of infection.  Your sexual activity has changed since you were last screened, and you are at increased risk for chlamydia or gonorrhea. Ask your health care provider if you are at risk.  Ask your health care provider about whether you are at high risk for HIV. Your health care provider  may recommend a prescription medicine to help prevent HIV infection. If you choose to take medicine to prevent HIV, you should first get tested for HIV. You should then be tested every 3 months for as long as you are taking the medicine.  Follow these instructions at home:  Alcohol use  Do not drink alcohol if your health care provider tells you not to drink.  If you drink alcohol:  Limit how much you have to 0-2 drinks a day.  Know how much alcohol is in your drink. In the [place] one drink equals one 12 oz bottle of beer (355 mL), one 5 oz glass of wine (148 mL), or one 1 oz glass of hard liquor (44 mL).  Lifestyle  Do not use any products that contain nicotine or tobacco. These products include cigarettes, chewing tobacco, and vaping devices, such as e-cigarettes. If you need help quitting, ask your health care provider.  Do not use street drugs.  Do not share needles.  Ask your health care provider for help if you need support or information about quitting drugs.  General instructions  Schedule regular health, dental, and eye exams.  Stay current with your vaccines.  Tell your health care provider if:  You often feel depressed.  You have ever been abused or do not feel safe at home.  Summary  Adopting a healthy lifestyle and getting preventive care are important in promoting health and wellness.  Follow your health care provider's instructions about healthy diet, exercising, and getting tested or screened for diseases.  Follow your health care provider's instructions on monitoring your cholesterol and blood pressure.  This information is not intended to replace advice given to you by your health care provider. Make sure you discuss any questions you have with your health care provider.  Document Revised: 02/13/2021 Document Reviewed: 02/13/2021  Elsevier Patient Education  2024 [employer].

## 2023-12-05 NOTE — Progress Notes (Signed)
 Subjective:    Patient ID: Dylan Webster, male    DOB: Sep 29, 1976, 48 y.o.   MRN: [id number]  HPI  Patient presents to clinic today for his annual exam.  He is requesting a work note stating that he is unable to take call and definitely due to side effects from his medication.  He reports he has insomnia and takes hydroxyzine at as needed to sleep and he is unable to go in when called in as this medication makes him drowsy.  Flu: 05/2022 Tetanus: 06/2020 COVID: Moderna x 3 Colon screening: 02/2022 Vision screening: annually Dentist: as needed  Diet: He does eat meat. He consumes fruits and veggies. He does eat fried foods. He drinks mostly water. Exercise: Walking  Review of Systems     Past Medical History:  Diagnosis Date   Allergy    GERD (gastroesophageal reflux disease)    HTN (hypertension)    OSA (obstructive sleep apnea)    Osteoarthritis of knees, bilateral    Sleep apnea    uses CPAP    Current Outpatient Medications  Medication Sig Dispense Refill   acetaminophen (TYLENOL) 500 MG tablet Take 500 mg by mouth every 6 (six) hours as needed for mild pain or moderate pain.     amLODipine (NORVASC) 5 MG tablet TAKE 2 TABLETS (10 MG TOTAL) BY MOUTH DAILY. 180 tablet 0   diclofenac Sodium (VOLTAREN ARTHRITIS PAIN) 1 % GEL Apply topically 4 (four) times daily as needed.     hydrOXYzine (ATARAX) 10 MG tablet Take 1 tablet (10 mg total) by mouth daily as needed. 90 tablet 0   ibuprofen (ADVIL) 200 MG tablet Take 200 mg by mouth daily as needed.     methylphenidate 36 MG PO CR tablet Take 36 mg by mouth daily.     omeprazole (PRILOSEC) 20 MG capsule TAKE 1 CAPSULE (20 MG TOTAL) BY MOUTH DAILY. 90 capsule 1   No current facility-administered medications for this visit.    No Known Allergies  Family History  Problem Relation Age of Onset   Alcohol abuse Mother    Diabetes Mother    Alcohol abuse Father    Heart disease Father    Hyperlipidemia Maternal Aunt     Leukemia Maternal Aunt    Heart disease Paternal Uncle    Hypertension Paternal Uncle    Alcohol abuse Paternal Grandfather    Hyperlipidemia Paternal Grandfather    Heart disease Paternal Grandfather    Hypertension Paternal Grandfather    Diabetes Paternal Grandfather    Kidney disease Paternal Grandfather    Colon cancer Neg Hx    Stomach cancer Neg Hx    Esophageal cancer Neg Hx    Rectal cancer Neg Hx     Social History   Socioeconomic History   Marital status: Married    Spouse name: Not on file   Number of children: 4   Years of education: Not on file   Highest education level: GED or equivalent  Occupational History   Occupation: maintenance  Tobacco Use   Smoking status: Former    Current packs/day: 0.00    Average packs/day: 1 pack/day for 12.0 years (12.0 ttl pk-yrs)    Types: Cigarettes    Start date: 12/22/2006    Quit date: 12/22/2018    Years since quitting: 4.9   Smokeless tobacco: Never   Tobacco comments:    quit 12/2018  Vaping Use   Vaping status: Never Used  Substance and Sexual Activity  Alcohol use: No   Drug use: No   Sexual activity: Yes    Birth control/protection: None  Other Topics Concern   Not on file  Social History Narrative   ** Merged History Encounter **       Married, 4 children  Job: [name]  Will start exercising regularly   Social Drivers of Health   Financial Resource Strain: Low Risk  (12/05/2023)   Overall Financial Resource Strain (CARDIA)    Difficulty of Paying Living Expenses: Not very hard  Food Insecurity: No Food Insecurity (12/05/2023)   Hunger Vital Sign    Worried About Running Out of Food in the Last Year: Never true    Ran Out of Food in the Last Year: Never true  Transportation Needs: No Transportation Needs (12/05/2023)   PRAPARE - Administrator, Civil Service (Medical): No    Lack of Transportation (Non-Medical): No  Physical Activity: Insufficiently Active (12/05/2023)    Exercise Vital Sign    Days of Exercise per Week: 1 day    Minutes of Exercise per Session: 30 min  Stress: Stress Concern Present (12/05/2023)   [employer] of Occupational Health - Occupational Stress Questionnaire    Feeling of Stress : To some extent  Social Connections: Moderately Integrated (12/05/2023)   Social Connection and Isolation Panel [NHANES]    Frequency of Communication with Friends and Family: More than three times a week    Frequency of Social Gatherings with Friends and Family: Twice a week    Attends Religious Services: More than 4 times per year    Active Member of [employer] or Organizations: No    Attends Engineer, structural: Not on file    Marital Status: Married  Intimate Partner Violence: Unknown (01/12/2022)   Received from [employer], [facility]   HITS    Physically Hurt: Not on file    Insult or Talk Down To: Not on file    Threaten Physical Harm: Not on file    Scream or Curse: Not on file     Constitutional: Denies fever, malaise, fatigue, headache or abrupt weight changes.  HEENT: Denies eye pain, eye redness, ear pain, ringing in the ears, wax buildup, runny nose, nasal congestion, bloody nose, or sore throat. Respiratory: Denies difficulty breathing, shortness of breath, cough or sputum production.   Cardiovascular: Denies chest pain, chest tightness, palpitations or swelling in the hands or feet.  Gastrointestinal: Denies abdominal pain, bloating, constipation, diarrhea or blood in the stool.  GU: Denies urgency, frequency, pain with urination, burning sensation, blood in urine, odor or discharge. Musculoskeletal: Denies decrease in range of motion, difficulty with gait, muscle pain or joint pain  swelling.  Skin: Denies redness, rashes, lesions or ulcercations.  Neurological: Patient reports inattention.  Denies dizziness, difficulty with memory, difficulty with speech or problems with balance and coordination.  Psych: Patient has  a history of anxiety.  Denies depression, SI/HI.  No other specific complaints in a complete review of systems (except as listed in HPI above).  Objective:   Physical Exam  BP (!) 144/82 (BP Location: Left Arm, Patient Position: Sitting, Cuff Size: Large)   Ht 5\' 8"  (1.727 m)   Wt 218 lb (98.9 kg)   BMI 33.15 kg/m    Wt Readings from Last 3 Encounters:  05/27/23 214 lb (97.1 kg)  03/11/23 223 lb 6.4 oz (101.3 kg)  12/03/22 223 lb (101.2 kg)    General: Appears his stated  age, obese, in NAD. Skin: Warm, dry and intact. No rashes noted. HEENT: Head: normal shape and size; Eyes: sclera white, no icterus, conjunctiva pink, PERRLA and EOMs intact;  Neck:  Neck supple, trachea midline. No masses, lumps or thyromegaly present.  Cardiovascular: Normal rate and rhythm. S1,S2 noted.  No murmur, rubs or gallops noted. No JVD or BLE edema.  Pulmonary/Chest: Normal effort and positive vesicular breath sounds. No respiratory distress. No wheezes, rales or ronchi noted.  Abdomen: Soft and nontender. Normal bowel sounds.  Musculoskeletal: Strength 5/5 BUE/BLE.  No difficulty with gait.  Neurological: Alert and oriented. Cranial nerves II-XII grossly intact. Coordination normal.  Psychiatric: Mood and affect normal. Behavior is normal. Judgment and thought content normal.     BMET    Component Value Date/Time   NA 139 05/27/2023 1108   NA 141 12/30/2019 1423   K 4.2 05/27/2023 1108   CL 108 05/27/2023 1108   CO2 26 05/27/2023 1108   GLUCOSE 88 05/27/2023 1108   BUN 17 05/27/2023 1108   BUN 16 12/30/2019 1423   CREATININE 0.79 05/27/2023 1108   CALCIUM 9.5 05/27/2023 1108   GFRNONAA >60 11/18/2021 0237   GFRAA 128 12/30/2019 1423    Lipid Panel     Component Value Date/Time   CHOL 170 05/27/2023 1108   TRIG 50 05/27/2023 1108   HDL 55 05/27/2023 1108   CHOLHDL 3.1 05/27/2023 1108   VLDL 19.4 06/09/2020 1553   LDLCALC 102 (H) 05/27/2023 1108    CBC    Component Value  Date/Time   WBC 5.6 05/27/2023 1108   RBC 3.99 (L) 05/27/2023 1108   HGB 12.7 (L) 05/27/2023 1108   HCT 38.7 05/27/2023 1108   PLT 235 05/27/2023 1108   MCV 97.0 05/27/2023 1108   MCH 31.8 05/27/2023 1108   MCHC 32.8 05/27/2023 1108   RDW 11.7 05/27/2023 1108   LYMPHSABS 3.8 04/07/2021 0238   MONOABS 0.7 04/07/2021 0238   EOSABS 0.3 04/07/2021 0238   BASOSABS 0.0 04/07/2021 0238    Hgb A1C Lab Results  Component Value Date   HGBA1C 4.9 05/27/2023          Assessment & Plan:   Preventative Health Maintenance:  Flu shot today Tetanus UTD Encouraged him to get his COVID booster Colon screening UTD Encouraged him to consume a balanced diet and exercise regimen Advised him to see an eye doctor and dentist annually We will check CBC, c-Met, lipid, A1c today  RTC in 6 months, follow-up chronic conditions Nicki Reaper, NP

## 2023-12-05 NOTE — Assessment & Plan Note (Signed)
 Continue hydroxyzine as needed Work note provided

## 2023-12-05 NOTE — Assessment & Plan Note (Signed)
 Encourage diet and exercise for weight loss

## 2023-12-06 ENCOUNTER — Telehealth: Payer: Self-pay

## 2023-12-06 ENCOUNTER — Encounter: Payer: Self-pay | Admitting: Internal Medicine

## 2023-12-06 LAB — COMPLETE METABOLIC PANEL WITH GFR
AG Ratio: 1.9 (calc) (ref 1.0–2.5)
ALT: 10 U/L (ref 9–46)
AST: 11 U/L (ref 10–40)
Albumin: 4.7 g/dL (ref 3.6–5.1)
Alkaline phosphatase (APISO): 91 U/L (ref 36–130)
BUN: 19 mg/dL (ref 7–25)
CO2: 28 mmol/L (ref 20–32)
Calcium: 9.8 mg/dL (ref 8.6–10.3)
Chloride: 105 mmol/L (ref 98–110)
Creat: 0.71 mg/dL (ref 0.60–1.29)
Globulin: 2.5 g/dL (ref 1.9–3.7)
Glucose, Bld: 92 mg/dL (ref 65–139)
Potassium: 4.2 mmol/L (ref 3.5–5.3)
Sodium: 139 mmol/L (ref 135–146)
Total Bilirubin: 1 mg/dL (ref 0.2–1.2)
Total Protein: 7.2 g/dL (ref 6.1–8.1)
eGFR: 114 mL/min/{1.73_m2} (ref >=60)

## 2023-12-06 LAB — CBC
HCT: 40.5 % (ref 38.5–50.0)
Hemoglobin: 13.4 g/dL (ref 13.2–17.1)
MCH: 32.1 pg (ref 27.0–33.0)
MCHC: 33.1 g/dL (ref 32.0–36.0)
MCV: 97.1 fL (ref 80.0–100.0)
MPV: 10.3 fL (ref 7.5–12.5)
Platelets: 292 10*3/uL (ref 140–400)
RBC: 4.17 10*6/uL — ABNORMAL LOW (ref 4.20–5.80)
RDW: 11.4 % (ref 11.0–15.0)
WBC: 7.1 10*3/uL (ref 3.8–10.8)

## 2023-12-06 LAB — HEMOGLOBIN A1C
Hgb A1c MFr Bld: 4.9 %{Hb} (ref <5.7)
Mean Plasma Glucose: 94 mg/dL
eAG (mmol/L): 5.2 mmol/L

## 2023-12-06 LAB — LIPID PANEL
Cholesterol: 199 mg/dL (ref <200)
HDL: 54 mg/dL (ref >=40)
LDL Cholesterol (Calc): 125 mg/dL — ABNORMAL HIGH
Non-HDL Cholesterol (Calc): 145 mg/dL — ABNORMAL HIGH (ref <130)
Total CHOL/HDL Ratio: 3.7 (calc) (ref <5.0)
Triglycerides: 97 mg/dL (ref <150)

## 2023-12-06 NOTE — Telephone Encounter (Signed)
 Copied from CRM [id number]. Topic: General - Call Back - No Documentation >> Dec 06, 2023 10:13 AM Gery Pray wrote: Reason for CRM: Patient returning call from [place]. Read message in chart. Patient insisted on speaking with Morrie Sheldon. Reached out to CAL now answer. Patient would like a callback from [place]. Callback number is [phone].

## 2023-12-06 NOTE — Telephone Encounter (Signed)
 This has been taken care of.

## 2023-12-19 ENCOUNTER — Encounter: Payer: Self-pay | Admitting: Internal Medicine

## 2023-12-19 ENCOUNTER — Ambulatory Visit: Payer: 59 | Admitting: Internal Medicine

## 2023-12-19 VITALS — BP 128/72 | Ht 68.0 in | Wt 221.8 lb

## 2023-12-19 DIAGNOSIS — E6609 Other obesity due to excess calories: Secondary | ICD-10-CM

## 2023-12-19 DIAGNOSIS — Z6833 Body mass index (BMI) 33.0-33.9, adult: Secondary | ICD-10-CM

## 2023-12-19 DIAGNOSIS — I10 Essential (primary) hypertension: Secondary | ICD-10-CM

## 2023-12-19 DIAGNOSIS — E66811 Obesity, class 1: Secondary | ICD-10-CM

## 2023-12-19 NOTE — Assessment & Plan Note (Signed)
Controlled on amlodipine-olmesartan Reinforced DASH diet and exercise for weight loss 

## 2023-12-19 NOTE — Progress Notes (Signed)
 Subjective:    Patient ID: Dylan Webster, male    DOB: 1975/12/03, 48 y.o.   MRN: [id number]  HPI  Patient presents to clinic today for 2-week follow-up of HTN.  At his last visit, his amlodipine was changed to amlodipine-olmesartan.  He has been taking the medication as prescribed.  His BP today is 128/72.  ECG from 10/2022 reviewed.  Review of Systems     Past Medical History:  Diagnosis Date   Allergy    Anxiety    GERD (gastroesophageal reflux disease)    HTN (hypertension)    OSA (obstructive sleep apnea)    Osteoarthritis of knees, bilateral    Sleep apnea    uses CPAP    Current Outpatient Medications  Medication Sig Dispense Refill   acetaminophen (TYLENOL) 500 MG tablet Take 500 mg by mouth every 6 (six) hours as needed for mild pain or moderate pain.     amlodipine-olmesartan (AZOR) 10-20 MG tablet Take 1 tablet by mouth daily. 90 tablet 1   diclofenac Sodium (VOLTAREN ARTHRITIS PAIN) 1 % GEL Apply topically 4 (four) times daily as needed. (Patient not taking: Reported on 12/05/2023)     hydrOXYzine (ATARAX) 10 MG tablet Take 1 tablet (10 mg total) by mouth daily as needed. 90 tablet 0   ibuprofen (ADVIL) 200 MG tablet Take 200 mg by mouth daily as needed.     methylphenidate 36 MG PO CR tablet Take 36 mg by mouth 2 (two) times daily.     omeprazole (PRILOSEC) 20 MG capsule TAKE 1 CAPSULE (20 MG TOTAL) BY MOUTH DAILY. 90 capsule 1   No current facility-administered medications for this visit.    Allergies  Allergen Reactions   Peanut (Diagnostic) Cough, Dermatitis, Hives, Itching and Rash    Family History  Problem Relation Age of Onset   Alcohol abuse Mother    Diabetes Mother    Alcohol abuse Father    Heart disease Father    Hyperlipidemia Maternal Aunt    Leukemia Maternal Aunt    Heart disease Paternal Uncle    Hypertension Paternal Uncle    Alcohol abuse Paternal Grandfather    Hyperlipidemia Paternal Grandfather    Heart disease Paternal  Grandfather    Hypertension Paternal Grandfather    Diabetes Paternal Grandfather    Kidney disease Paternal Grandfather    Colon cancer Neg Hx    Stomach cancer Neg Hx    Esophageal cancer Neg Hx    Rectal cancer Neg Hx     Social History   Socioeconomic History   Marital status: Married    Spouse name: Not on file   Number of children: 4   Years of education: Not on file   Highest education level: GED or equivalent  Occupational History   Occupation: maintenance  Tobacco Use   Smoking status: Former    Current packs/day: 0.00    Average packs/day: 1 pack/day for 12.0 years (12.0 ttl pk-yrs)    Types: Cigarettes    Start date: 12/22/2006    Quit date: 12/22/2018    Years since quitting: 4.9   Smokeless tobacco: Never   Tobacco comments:    quit 12/2018  Vaping Use   Vaping status: Never Used  Substance and Sexual Activity   Alcohol use: No   Drug use: No   Sexual activity: Yes    Birth control/protection: None  Other Topics Concern   Not on file  Social History Narrative   ** Merged History  Encounter **       Married, 4 children  Job: [name]  Will start exercising regularly   Social Drivers of Health   Financial Resource Strain: Low Risk  (12/05/2023)   Overall Financial Resource Strain (CARDIA)    Difficulty of Paying Living Expenses: Not very hard  Food Insecurity: No Food Insecurity (12/05/2023)   Hunger Vital Sign    Worried About Running Out of Food in the Last Year: Never true    Ran Out of Food in the Last Year: Never true  Transportation Needs: No Transportation Needs (12/05/2023)   PRAPARE - Administrator, Civil Service (Medical): No    Lack of Transportation (Non-Medical): No  Physical Activity: Insufficiently Active (12/05/2023)   Exercise Vital Sign    Days of Exercise per Week: 1 day    Minutes of Exercise per Session: 30 min  Stress: Stress Concern Present (12/05/2023)   [employer] of Occupational Health -  Occupational Stress Questionnaire    Feeling of Stress : To some extent  Social Connections: Moderately Integrated (12/05/2023)   Social Connection and Isolation Panel [NHANES]    Frequency of Communication with Friends and Family: More than three times a week    Frequency of Social Gatherings with Friends and Family: Twice a week    Attends Religious Services: More than 4 times per year    Active Member of [employer] or Organizations: No    Attends Engineer, structural: Not on file    Marital Status: Married  Intimate Partner Violence: Unknown (01/12/2022)   Received from [employer], [facility]   HITS    Physically Hurt: Not on file    Insult or Talk Down To: Not on file    Threaten Physical Harm: Not on file    Scream or Curse: Not on file     Constitutional: Denies fever, malaise, fatigue, headache or abrupt weight changes.  HEENT: Denies eye pain, eye redness, ear pain, ringing in the ears, wax buildup, runny nose, nasal congestion, bloody nose, or sore throat. Respiratory: Denies difficulty breathing, shortness of breath, cough or sputum production.   Cardiovascular: Denies chest pain, chest tightness, palpitations or swelling in the hands or feet.  Musculoskeletal: Denies decrease in range of motion, difficulty with gait, muscle pain or joint pain  swelling.  Neurological: Patient reports inattention.  Denies dizziness, difficulty with memory, difficulty with speech or problems with balance and coordination.   No other specific complaints in a complete review of systems (except as listed in HPI above).  Objective:   Physical Exam  BP 128/72 (BP Location: Left Arm, Patient Position: Sitting, Cuff Size: Large)   Ht 5\' 8"  (1.727 m)   Wt 221 lb 12.8 oz (100.6 kg)   BMI 33.72 kg/m     Wt Readings from Last 3 Encounters:  12/05/23 218 lb (98.9 kg)  05/27/23 214 lb (97.1 kg)  03/11/23 223 lb 6.4 oz (101.3 kg)    General: Appears his stated age, obese, in  NAD. HEENT: Head: normal shape and size; Eyes: sclera white, no icterus, conjunctiva pink, PERRLA and EOMs intact;  Cardiovascular: Normal rate and rhythm. S1,S2 noted.  No murmur, rubs or gallops noted. No JVD or BLE edema.  Pulmonary/Chest: Normal effort and positive vesicular breath sounds. No respiratory distress. No wheezes, rales or ronchi noted.  Neurological: Alert and oriented.    BMET    Component Value Date/Time   NA 139 12/05/2023 0824  NA 141 12/30/2019 1423   K 4.2 12/05/2023 0824   CL 105 12/05/2023 0824   CO2 28 12/05/2023 0824   GLUCOSE 92 12/05/2023 0824   BUN 19 12/05/2023 0824   BUN 16 12/30/2019 1423   CREATININE 0.71 12/05/2023 0824   CALCIUM 9.8 12/05/2023 0824   GFRNONAA >60 11/18/2021 0237   GFRAA 128 12/30/2019 1423    Lipid Panel     Component Value Date/Time   CHOL 199 12/05/2023 0824   TRIG 97 12/05/2023 0824   HDL 54 12/05/2023 0824   CHOLHDL 3.7 12/05/2023 0824   VLDL 19.4 06/09/2020 1553   LDLCALC 125 (H) 12/05/2023 0824    CBC    Component Value Date/Time   WBC 7.1 12/05/2023 0824   RBC 4.17 (L) 12/05/2023 0824   HGB 13.4 12/05/2023 0824   HCT 40.5 12/05/2023 0824   PLT 292 12/05/2023 0824   MCV 97.1 12/05/2023 0824   MCH 32.1 12/05/2023 0824   MCHC 33.1 12/05/2023 0824   RDW 11.4 12/05/2023 0824   LYMPHSABS 3.8 04/07/2021 0238   MONOABS 0.7 04/07/2021 0238   EOSABS 0.3 04/07/2021 0238   BASOSABS 0.0 04/07/2021 0238    Hgb A1C Lab Results  Component Value Date   HGBA1C 4.9 12/05/2023          Assessment & Plan:     RTC in 5 months, follow-up chronic conditions Nicki Reaper, NP

## 2023-12-19 NOTE — Patient Instructions (Signed)

## 2023-12-19 NOTE — Assessment & Plan Note (Signed)
 Encourage diet and exercise for weight loss

## 2024-01-09 ENCOUNTER — Encounter: Payer: Self-pay | Admitting: Internal Medicine

## 2024-02-17 ENCOUNTER — Other Ambulatory Visit: Payer: Self-pay | Admitting: Internal Medicine

## 2024-02-18 ENCOUNTER — Other Ambulatory Visit: Payer: Self-pay | Admitting: Internal Medicine

## 2024-02-18 MED ORDER — HYDROXYZINE HCL 10 MG PO TABS: 10.0000 mg | ORAL_TABLET | Freq: Every day | ORAL | 0 refills | Status: DC | PRN

## 2024-02-19 NOTE — Telephone Encounter (Signed)
 Requested Prescriptions  Pending Prescriptions Disp Refills   omeprazole  (PRILOSEC) 20 MG capsule [Pharmacy Med Name: OMEPRAZOLE  20 MG CPDR 20 Capsule] 90 capsule 2    Sig: TAKE 1 CAPSULE (20 MG TOTAL) BY MOUTH DAILY.     Gastroenterology: Proton Pump Inhibitors Passed - 02/19/2024 11:46 AM      Passed - Valid encounter within last 12 months    Recent Outpatient Visits           2 months ago Hypertension, essential   [place] [facility] [place], Rankin Buzzard, NP   2 months ago Encounter for general adult medical examination with abnormal findings   [place] [facility] [employer], Rankin Buzzard, NP               hydrOXYzine  (ATARAX ) 10 MG tablet [Pharmacy Med Name: HYDROXYZINE  HCL 10 MG TABS 10 Tablet] 90 tablet 1    Sig: TAKE 1 TABLET (10 MG TOTAL) BY MOUTH DAILY AS NEEDED.     Ear, Nose, and Throat:  Antihistamines 2 Passed - 02/19/2024 11:46 AM      Passed - Cr in normal range and within 360 days    Creat  Date Value Ref Range Status  12/05/2023 0.71 0.60 - 1.29 mg/dL Final   Creatinine, Urine  Date Value Ref Range Status  12/03/2022 91 20 - 320 mg/dL Final         Passed - Valid encounter within last 12 months    Recent Outpatient Visits           2 months ago Hypertension, essential   [place] [facility] [place], Rankin Buzzard, NP   2 months ago Encounter for general adult medical examination with abnormal findings   [place] [facility] [place], Rankin Buzzard, NP

## 2024-03-08 NOTE — Progress Notes (Signed)
 HPI M former smoker followed for OSA, complicated by HBP, Hypercholesterolemia,  NPSG 04/01/2019- AHI 93.3/ hr, desaturation to 75%, body weight 244 lbs  -------------------------------------------------------------------------------------------    03/09/22- 46 yoM former smoker followed for OSA, complicated by HTN, GERD, Obesity, Hypercholesterolemia, Osteoarthritis,  CPAP auto 10-18/ [facility] [phone]) Download-  Body weight today- Covid vax-3 Moderna Flu vax-had -----Patient would like to talk about CPAP mask states that sometimes it leaks and then if he tightens it it hurts the back of his head. His DME company needs the machine serial number so they can install a card or download software and we will try to expedite that.  He is well pleased with his CPAP except for mask comfort.  Daytime sleepiness is markedly improved and he feels safe driving. Getting help for GERD and for osteoarthritis in his knees, blood pressure but otherwise no heart or lung problems.  03/11/23- 47 yoM former smoker followed for OSA, complicated by HTN, GERD, Obesity, Hypercholesterolemia, Osteoarthritis,  CPAP auto 10-18/ [facility] [phone]) Download compliance  Body weight today- LOV Cobb, NP 11/09/22- Prop clearance R TKR. Good CPAP compliance and control. He provides download onto MyChart documenting 100% compliance with AHI 0.8/hour.  He says he definitely sleeps better with CPAP.  Wife would scold him if he did not wear it. He is trying to keep weight down.  05/16/23- Addendum- Pulmonary status clear for necessary surgery. Note that our NP cleared for surgery also, in February.  03/10/24- 32 yoM former smoker followed for OSA, complicated by HTN, GERD, Obesity, Hypercholesterolemia, Osteoarthritis,  CPAP auto 10-18/ [facility] [phone]) Download compliance 93%, AHI 1.2/hr   (he sent as PDF into MyChart from [employer] weight today-223 lbs Does well with CPAP and sleeps  better,  but strp bothers back of neck. Continues ADHD meds, and occasional hydralazine to unwind at night.  ROS-see HPI   + = positive Constitutional:    weight loss, night sweats, fevers, chills,+ fatigue, lassitude. HEENT:    headaches, difficulty swallowing, tooth/dental problems, sore throat,       sneezing, itching, ear ache, nasal congestion, post nasal drip, snoring CV:    chest pain, orthopnea, PND, swelling in lower extremities, anasarca,                                   dizziness, palpitations Resp:   shortness of breath with exertion or at rest.                productive cough,   non-productive cough, coughing up of blood.              change in color of mucus.  wheezing.   Skin:    rash or lesions. GI:  + heartburn, indigestion, abdominal pain, nausea, vomiting, diarrhea,                 change in bowel habits, loss of appetite GU: dysuria, change in color of urine, no urgency or frequency.   flank pain. MS:  + joint pain, stiffness, decreased range of motion, back pain. Neuro-     nothing unusual Psych:  change in mood or affect.  depression or anxiety.   memory loss.  OBJ- Physical Exam General- Alert, Oriented, Affect-appropriate, Distress- none acute, + overweight Skin- rash-none, lesions- none, excoriation- none Lymphadenopathy- none Head- atraumatic  Eyes- Gross vision intact, PERRLA, conjunctivae and secretions clear            Ears- Hearing, canals-normal            Nose- Clear, no-Septal dev, mucus, polyps, erosion, perforation             Throat- Mallampati IV, mucosa clear , drainage- none, tonsils- atrophic, + teeth Neck- flexible , trachea midline, no stridor , thyroid  nl, carotid no bruit Chest - symmetrical excursion , unlabored           Heart/CV- RRR , no murmur , no gallop  , no rub, nl s1 s2                           - JVD- none , edema- none, stasis changes- none, varices- none           Lung- clear to P&A, wheeze- none, cough- none ,  dullness-none, rub- none           Chest wall-  Abd-  Br/ Gen/ Rectal- Not done, not indicated Extrem- cyanosis- none, clubbing, none, atrophy- none, strength- nl Neuro- grossly intact to observation

## 2024-03-10 ENCOUNTER — Ambulatory Visit: Payer: BC Managed Care – PPO | Admitting: Internal Medicine

## 2024-03-10 ENCOUNTER — Encounter: Payer: Self-pay | Admitting: Internal Medicine

## 2024-03-10 VITALS — BP 148/87 | HR 82 | Temp 98.4°F | Resp 18 | Ht 69.0 in | Wt 223.4 lb

## 2024-03-10 DIAGNOSIS — G4733 Obstructive sleep apnea (adult) (pediatric): Secondary | ICD-10-CM | POA: Diagnosis not present

## 2024-03-10 DIAGNOSIS — Z87891 Personal history of nicotine dependence: Secondary | ICD-10-CM

## 2024-03-10 DIAGNOSIS — F5101 Primary insomnia: Secondary | ICD-10-CM

## 2024-03-10 DIAGNOSIS — G4700 Insomnia, unspecified: Secondary | ICD-10-CM

## 2024-03-10 NOTE — Telephone Encounter (Signed)
 For your review

## 2024-03-10 NOTE — Patient Instructions (Signed)
 We can continue CPAP auto 10-18. Please let us  know if we can help

## 2024-04-12 ENCOUNTER — Encounter: Payer: Self-pay | Admitting: Internal Medicine

## 2024-04-12 NOTE — Assessment & Plan Note (Signed)
Benefits from CPAP with good compliance and control Plan-continue auto 10-18 

## 2024-04-12 NOTE — Assessment & Plan Note (Signed)
 Discussed sleep hygiene, meds and role of hydroxyzine .

## 2024-05-18 ENCOUNTER — Ambulatory Visit (INDEPENDENT_AMBULATORY_CARE_PROVIDER_SITE_OTHER): Admitting: Internal Medicine

## 2024-05-18 VITALS — BP 132/74 | Ht 69.0 in | Wt 217.6 lb

## 2024-05-18 DIAGNOSIS — K219 Gastro-esophageal reflux disease without esophagitis: Secondary | ICD-10-CM | POA: Diagnosis not present

## 2024-05-18 DIAGNOSIS — E7800 Pure hypercholesterolemia, unspecified: Secondary | ICD-10-CM | POA: Diagnosis not present

## 2024-05-18 DIAGNOSIS — F419 Anxiety disorder, unspecified: Secondary | ICD-10-CM

## 2024-05-18 DIAGNOSIS — E66811 Obesity, class 1: Secondary | ICD-10-CM

## 2024-05-18 DIAGNOSIS — I10 Essential (primary) hypertension: Secondary | ICD-10-CM | POA: Diagnosis not present

## 2024-05-18 DIAGNOSIS — G4733 Obstructive sleep apnea (adult) (pediatric): Secondary | ICD-10-CM

## 2024-05-18 DIAGNOSIS — Z6832 Body mass index (BMI) 32.0-32.9, adult: Secondary | ICD-10-CM

## 2024-05-18 DIAGNOSIS — F902 Attention-deficit hyperactivity disorder, combined type: Secondary | ICD-10-CM

## 2024-05-18 DIAGNOSIS — E6609 Other obesity due to excess calories: Secondary | ICD-10-CM

## 2024-05-18 DIAGNOSIS — M170 Bilateral primary osteoarthritis of knee: Secondary | ICD-10-CM

## 2024-05-18 LAB — LIPID PANEL
Cholesterol: 193 mg/dL (ref <200)
HDL: 51 mg/dL (ref >=40)
LDL Cholesterol (Calc): 125 mg/dL — ABNORMAL HIGH
Non-HDL Cholesterol (Calc): 142 mg/dL — ABNORMAL HIGH (ref <130)
Total CHOL/HDL Ratio: 3.8 (calc) (ref <5.0)
Triglycerides: 80 mg/dL (ref <150)

## 2024-05-18 LAB — COMPREHENSIVE METABOLIC PANEL WITH GFR
AG Ratio: 1.8 (calc) (ref 1.0–2.5)
ALT: 14 U/L (ref 9–46)
AST: 12 U/L (ref 10–40)
Albumin: 4.8 g/dL (ref 3.6–5.1)
Alkaline phosphatase (APISO): 79 U/L (ref 36–130)
BUN: 17 mg/dL (ref 7–25)
CO2: 28 mmol/L (ref 20–32)
Calcium: 9.6 mg/dL (ref 8.6–10.3)
Chloride: 104 mmol/L (ref 98–110)
Creat: 0.83 mg/dL (ref 0.60–1.29)
Globulin: 2.6 g/dL (ref 1.9–3.7)
Glucose, Bld: 92 mg/dL (ref 65–99)
Potassium: 4.6 mmol/L (ref 3.5–5.3)
Sodium: 140 mmol/L (ref 135–146)
Total Bilirubin: 1.1 mg/dL (ref 0.2–1.2)
Total Protein: 7.4 g/dL (ref 6.1–8.1)
eGFR: 109 mL/min/1.73m2 (ref >=60)

## 2024-05-18 LAB — CBC
HCT: 38.8 % (ref 38.5–50.0)
Hemoglobin: 12.9 g/dL — ABNORMAL LOW (ref 13.2–17.1)
MCH: 32.6 pg (ref 27.0–33.0)
MCHC: 33.2 g/dL (ref 32.0–36.0)
MCV: 98 fL (ref 80.0–100.0)
MPV: 10.3 fL (ref 7.5–12.5)
Platelets: 243 Thousand/uL (ref 140–400)
RBC: 3.96 Million/uL — ABNORMAL LOW (ref 4.20–5.80)
RDW: 11.5 % (ref 11.0–15.0)
WBC: 7.1 Thousand/uL (ref 3.8–10.8)

## 2024-05-18 MED ORDER — AMLODIPINE-OLMESARTAN 10-20 MG PO TABS: 1.0000 | ORAL_TABLET | Freq: Every day | ORAL | 1 refills | Status: AC

## 2024-05-18 MED ORDER — OMEPRAZOLE 20 MG PO CPDR: 20.0000 mg | DELAYED_RELEASE_CAPSULE | Freq: Every day | ORAL | 1 refills | Status: AC

## 2024-05-18 NOTE — Assessment & Plan Note (Signed)
 Pain resolved s/p bilateral knee replacements He will continue to see ortho yearly

## 2024-05-18 NOTE — Assessment & Plan Note (Signed)
 Encourage diet and exercise for weight loss

## 2024-05-18 NOTE — Assessment & Plan Note (Signed)
 Continue hydroxyzine  10 mg daily as needed Support offered

## 2024-05-18 NOTE — Assessment & Plan Note (Signed)
 Controlled on amlodipine -olmesartan  10-20 mg daily Reinforced DASH diet and exercise for weight loss CMET today

## 2024-05-18 NOTE — Assessment & Plan Note (Signed)
 Encourage weight loss as this can help reduce sleep apnea symptoms Continue CPAP use

## 2024-05-18 NOTE — Assessment & Plan Note (Signed)
 C-Met and lipid profile today Encouraged him to consume a low-fat diet

## 2024-05-18 NOTE — Assessment & Plan Note (Signed)
 Continue methylphenidate 36 mb BID and atomoxetine 25 mg as prescribed He will continue to follow with psychiatry

## 2024-05-18 NOTE — Assessment & Plan Note (Signed)
 Avoid foods that trigger reflux Encourage weight loss as this can help reduce reflux symptoms Continue omeprazole  20 mg daily

## 2024-05-18 NOTE — Progress Notes (Signed)
 Subjective:    Patient ID: Dylan Webster, male    DOB: 03/27/1976, 48 y.o.   MRN: [id number]  HPI  Patient presents to clinic today for follow-up of chronic conditions.  HTN: His BP today is 132/74.  He is taking amlodipine -olmesartan  as prescribed.  ECG from 10/2022 reviewed.  OSA: He averages 6 hours of sleep per night with the use of CPAP.  Sleep study from 03/2019 reviewed.  Anxiety: Intermittent.  He takes hydroxyzine  as needed with good relief of symptoms.  He is not currently seeing a therapist.  He denies depression, SI/HI.  GERD: Triggered by red meat and laying down after eating.  He denies breakthrough on omeprazole .  There is no upper GI on file.  HLD: His last LDL was 125, triglycerides 97, 11/2023.  He is not taking a cholesterol-lowering medication at this time.  He tries to consume a low fat diet.  OA: Mainly in his knees but resolved s/p bilateral knee replacements. He is not taking any medication for this at this time. He follows with orthopedics.  ADHD: He reports mainly inattention. Managed with methylphenidate and atomoxetine. He follows with psychiatry.  Review of Systems     Past Medical History:  Diagnosis Date   Allergy    Anxiety    GERD (gastroesophageal reflux disease)    HTN (hypertension)    OSA (obstructive sleep apnea)    Osteoarthritis of knees, bilateral    Sleep apnea    uses CPAP    Current Outpatient Medications  Medication Sig Dispense Refill   acetaminophen  (TYLENOL ) 500 MG tablet Take 500 mg by mouth every 6 (six) hours as needed for mild pain or moderate pain.     amlodipine -olmesartan  (AZOR ) 10-20 MG tablet Take 1 tablet by mouth daily. 90 tablet 1   hydrOXYzine  (ATARAX ) 10 MG tablet Take 1 tablet (10 mg total) by mouth daily as needed. 90 tablet 0   ibuprofen  (ADVIL ) 200 MG tablet Take 200 mg by mouth daily as needed.     methylphenidate 36 MG PO CR tablet Take 36 mg by mouth 2 (two) times daily.     omeprazole  (PRILOSEC) 20 MG  capsule TAKE 1 CAPSULE (20 MG TOTAL) BY MOUTH DAILY. 90 capsule 0   No current facility-administered medications for this visit.    Allergies  Allergen Reactions   Peanut (Diagnostic) Cough, Dermatitis, Hives, Itching and Rash    Family History  Problem Relation Age of Onset   Alcohol abuse Mother    Diabetes Mother    Alcohol abuse Father    Heart disease Father    Hyperlipidemia Maternal Aunt    Leukemia Maternal Aunt    Heart disease Paternal Uncle    Hypertension Paternal Uncle    Alcohol abuse Paternal Grandfather    Hyperlipidemia Paternal Grandfather    Heart disease Paternal Grandfather    Hypertension Paternal Grandfather    Diabetes Paternal Grandfather    Kidney disease Paternal Grandfather    Colon cancer Neg Hx    Stomach cancer Neg Hx    Esophageal cancer Neg Hx    Rectal cancer Neg Hx     Social History   Socioeconomic History   Marital status: Married    Spouse name: Not on file   Number of children: 4   Years of education: Not on file   Highest education level: GED or equivalent  Occupational History   Occupation: maintenance  Tobacco Use   Smoking status: Former  Current packs/day: 0.00    Average packs/day: 1 pack/day for 12.0 years (12.0 ttl pk-yrs)    Types: Cigarettes    Start date: 12/22/2006    Quit date: 12/22/2018    Years since quitting: 5.4   Smokeless tobacco: Never   Tobacco comments:    quit 12/2018  Vaping Use   Vaping status: Never Used  Substance and Sexual Activity   Alcohol use: No   Drug use: No   Sexual activity: Yes    Birth control/protection: None  Other Topics Concern   Not on file  Social History Narrative   ** Merged History Encounter **       Married, 4 children  Job: [employer]  Will start exercising regularly   Social Drivers of Health   Financial Resource Strain: Low Risk  (05/18/2024)   Overall Financial Resource Strain (CARDIA)    Difficulty of Paying Living Expenses: Not hard at  all  Food Insecurity: No Food Insecurity (05/18/2024)   Hunger Vital Sign    Worried About Running Out of Food in the Last Year: Never true    Ran Out of Food in the Last Year: Never true  Transportation Needs: No Transportation Needs (05/18/2024)   PRAPARE - Administrator, Civil Service (Medical): No    Lack of Transportation (Non-Medical): No  Physical Activity: Insufficiently Active (05/18/2024)   Exercise Vital Sign    Days of Exercise per Week: 2 days    Minutes of Exercise per Session: 10 min  Stress: No Stress Concern Present (05/18/2024)   [employer] of Occupational Health - Occupational Stress Questionnaire    Feeling of Stress: Only a little  Social Connections: Unknown (05/18/2024)   Social Connection and Isolation Panel    Frequency of Communication with Friends and Family: Once a week    Frequency of Social Gatherings with Friends and Family: Once a week    Attends Religious Services: More than 4 times per year    Active Member of [employer] or Organizations: Not on file    Attends Banker Meetings: Not on file    Marital Status: Married  Intimate Partner Violence: Unknown (01/12/2022)   Received from [facility]   HITS    Physically Hurt: Not on file    Insult or Talk Down To: Not on file    Threaten Physical Harm: Not on file    Scream or Curse: Not on file     Constitutional: Denies fever, malaise, fatigue, headache or abrupt weight changes.  HEENT: Denies eye pain, eye redness, ear pain, ringing in the ears, wax buildup, runny nose, nasal congestion, bloody nose, or sore throat. Respiratory: Denies difficulty breathing, shortness of breath, cough or sputum production.   Cardiovascular: Denies chest pain, chest tightness, palpitations or swelling in the hands or feet.  Gastrointestinal: Denies abdominal pain, bloating, constipation, diarrhea or blood in the stool.  GU: Denies urgency, frequency, pain with urination, burning sensation, blood  in urine, odor or discharge. Musculoskeletal: Denies decrease in range of motion, difficulty with gait, muscle pain or joint pain or swelling.  Skin: Denies redness, rashes, lesions or ulcercations.  Neurological: Pt reports inattention. Denies dizziness, difficulty with memory, difficulty with speech or problems with balance and coordination.  Psych: Patient has a history of anxiety.  Denies depression, SI/HI.  No other specific complaints in a complete review of systems (except as listed in HPI above).  Objective:   Physical Exam BP 132/74 (BP Location:  Left Arm, Patient Position: Sitting, Cuff Size: Large)   Ht 5' 9 (1.753 m)   Wt 217 lb 9.6 oz (98.7 kg)   BMI 32.13 kg/m    Wt Readings from Last 3 Encounters:  03/10/24 223 lb 6.4 oz (101.3 kg)  12/19/23 221 lb 12.8 oz (100.6 kg)  12/05/23 218 lb (98.9 kg)    General: Appears his stated age, obese, in NAD. Skin: Warm, dry and intact.  HEENT: Head: normal shape and size; Eyes: sclera white, no icterus, conjunctiva pink, PERRLA and EOMs intact;  Cardiovascular: Normal rate and rhythm. S1,S2 noted.  No murmur, rubs or gallops noted. No JVD or BLE edema.  Pulmonary/Chest: Normal effort and positive vesicular breath sounds. No respiratory distress. No wheezes, rales or ronchi noted.  Abdomen: Normal bowel sounds.  Musculoskeletal: No difficulty with gait.  Neurological: Alert and oriented. Coordination normal.  Psychiatric: Mood and affect normal. Behavior is normal. Judgment and thought content normal.    BMET    Component Value Date/Time   NA 139 12/05/2023 0824   NA 141 12/30/2019 1423   K 4.2 12/05/2023 0824   CL 105 12/05/2023 0824   CO2 28 12/05/2023 0824   GLUCOSE 92 12/05/2023 0824   BUN 19 12/05/2023 0824   BUN 16 12/30/2019 1423   CREATININE 0.71 12/05/2023 0824   CALCIUM 9.8 12/05/2023 0824   GFRNONAA >60 11/18/2021 0237   GFRAA 128 12/30/2019 1423    Lipid Panel     Component Value Date/Time   CHOL 199  12/05/2023 0824   TRIG 97 12/05/2023 0824   HDL 54 12/05/2023 0824   CHOLHDL 3.7 12/05/2023 0824   VLDL 19.4 06/09/2020 1553   LDLCALC 125 (H) 12/05/2023 0824    CBC    Component Value Date/Time   WBC 7.1 12/05/2023 0824   RBC 4.17 (L) 12/05/2023 0824   HGB 13.4 12/05/2023 0824   HCT 40.5 12/05/2023 0824   PLT 292 12/05/2023 0824   MCV 97.1 12/05/2023 0824   MCH 32.1 12/05/2023 0824   MCHC 33.1 12/05/2023 0824   RDW 11.4 12/05/2023 0824   LYMPHSABS 3.8 04/07/2021 0238   MONOABS 0.7 04/07/2021 0238   EOSABS 0.3 04/07/2021 0238   BASOSABS 0.0 04/07/2021 0238    Hgb A1C Lab Results  Component Value Date   HGBA1C 4.9 12/05/2023           Assessment & Plan:    RTC in 6 months for your annual exam Angeline Laura, NP

## 2024-05-18 NOTE — Patient Instructions (Signed)

## 2024-05-19 ENCOUNTER — Ambulatory Visit: Payer: Self-pay | Admitting: Internal Medicine

## 2024-09-29 ENCOUNTER — Other Ambulatory Visit: Payer: Self-pay | Admitting: Internal Medicine

## 2024-09-30 NOTE — Telephone Encounter (Signed)
 Requested Prescriptions  Pending Prescriptions Disp Refills   hydrOXYzine  (ATARAX ) 10 MG tablet [Pharmacy Med Name: HYDROXYZINE  HCL 10 MG TABS 10 Tablet] 90 tablet 0    Sig: TAKE 1 TABLET (10 MG TOTAL) BY MOUTH DAILY AS NEEDED.     Ear, Nose, and Throat:  Antihistamines 2 Passed - 09/30/2024  3:06 PM      Passed - Cr in normal range and within 360 days    Creat  Date Value Ref Range Status  05/18/2024 0.83 0.60 - 1.29 mg/dL Final   Creatinine, Urine  Date Value Ref Range Status  12/03/2022 91 20 - 320 mg/dL Final         Passed - Valid encounter within last 12 months    Recent Outpatient Visits           4 months ago Pure hypercholesterolemia   [facility] [place], Angeline ORN, NP   9 months ago Hypertension, essential   [place] [facility] [place], Angeline ORN, NP   10 months ago Encounter for general adult medical examination with abnormal findings   [facility] [place], Angeline ORN, NP

## 2024-12-08 ENCOUNTER — Encounter: Admitting: Internal Medicine
